# Patient Record
Sex: Female | Born: 1990 | Race: White | Hispanic: No | Smoking: Current every day smoker
Health system: Southern US, Community
[De-identification: ages and names within clinical notes are randomized; demographics above are authoritative.]

## PROBLEM LIST (undated history)

## (undated) DIAGNOSIS — B977 Papillomavirus as the cause of diseases classified elsewhere: Secondary | ICD-10-CM

## (undated) HISTORY — PX: FINGER SURGERY: SHX640

## (undated) HISTORY — DX: Papillomavirus as the cause of diseases classified elsewhere: B97.7

---
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
## 2013-08-12 ENCOUNTER — Encounter (HOSPITAL_COMMUNITY): Payer: Self-pay | Admitting: Emergency Medicine

## 2013-08-12 ENCOUNTER — Emergency Department (HOSPITAL_COMMUNITY)
Admission: EM | Admit: 2013-08-12 | Discharge: 2013-08-12 | Disposition: A | Discharge status: Home / Self Care | Payer: Medicaid Other | Attending: Emergency Medicine | Admitting: Emergency Medicine

## 2013-08-12 DIAGNOSIS — B9789 Other viral agents as the cause of diseases classified elsewhere: Secondary | ICD-10-CM | POA: Diagnosis not present

## 2013-08-12 DIAGNOSIS — F17200 Nicotine dependence, unspecified, uncomplicated: Secondary | ICD-10-CM | POA: Insufficient documentation

## 2013-08-12 DIAGNOSIS — B349 Viral infection, unspecified: Secondary | ICD-10-CM

## 2013-08-12 DIAGNOSIS — R112 Nausea with vomiting, unspecified: Secondary | ICD-10-CM | POA: Diagnosis present

## 2013-08-12 NOTE — Progress Notes (Signed)
[facility] did not get to see patient but will be sending information about [facility] orange Card using the address provided, to help patient establish primary care.

## 2013-08-12 NOTE — Discharge Instructions (Signed)
Viral Infections °A virus is a type of germ. Viruses can cause: °· Minor sore throats. °· Aches and pains. °· Headaches. °· Runny nose. °· Rashes. °· Watery eyes. °· Tiredness. °· Coughs. °· Loss of appetite. °· Feeling sick to your stomach (nausea). °· Throwing up (vomiting). °· Watery poop (diarrhea). °HOME CARE  °· Only take medicines as told by your doctor. °· Drink enough water and fluids to keep your pee (urine) clear or pale yellow. Sports drinks are a good choice. °· Get plenty of rest and eat healthy. Soups and broths with crackers or rice are fine. °GET HELP RIGHT AWAY IF:  °· You have a very bad headache. °· You have shortness of breath. °· You have chest pain or neck pain. °· You have an unusual rash. °· You cannot stop throwing up. °· You have watery poop that does not stop. °· You cannot keep fluids down. °· You or your child has a temperature by mouth above 102° F (38.9° C), not controlled by medicine. °· Your baby is older than 3 months with a rectal temperature of 102° F (38.9° C) or higher. °· Your baby is 3 months old or younger with a rectal temperature of 100.4° F (38° C) or higher. °MAKE SURE YOU:  °· Understand these instructions. °· Will watch this condition. °· Will get help right away if you are not doing well or get worse. °Document Released: 03/02/2008 Document Revised: 06/12/2011 Document Reviewed: 07/26/2010 °ExitCare® Patient Information ©2014 [employer]. ° °

## 2013-08-12 NOTE — ED Provider Notes (Signed)
CSN: [id number]     Arrival date & time 08/12/13  [id number] History   First MD Initiated Contact with Patient 08/12/13 1015     Chief Complaint  Patient presents with  . Nausea  . Emesis  . Diarrhea     (Consider location/radiation/quality/duration/timing/severity/associated sxs/prior Treatment) HPI Comments: Patient presents emergency department with chief complaint of nausea, vomiting, diarrhea, that started last night. She has had one episode of vomiting and one episode of diarrhea. She denies any hematemesis or melena. She has not tried taking anything to alleviate her symptoms. She states that she also has body aches, but denies any fevers or chills. She denies any aggravating or alleviating factors.   The history is provided by the patient. No language interpreter was used.    History reviewed. No pertinent past medical history. History reviewed. No pertinent past surgical history. History reviewed. No pertinent family history. History  Substance Use Topics  . Smoking status: Current Every Day Smoker -- 0.50 packs/day    Types: Cigarettes  . Smokeless tobacco: Not on file  . Alcohol Use: No   OB History   Grav Para Term Preterm Abortions TAB SAB Ect Mult Living                 Review of Systems  Constitutional: Negative for fever and chills.  Respiratory: Negative for shortness of breath.   Cardiovascular: Negative for chest pain.  Gastrointestinal: Positive for nausea, vomiting and diarrhea. Negative for constipation.  Genitourinary: Negative for dysuria.      Allergies  No known allergies  Home Medications   Prior to Admission medications   Not on File   BP 106/71  Pulse 95  Temp(Src) 98.7 F (37.1 C) (Oral)  Resp 18  SpO2 100%  LMP 07/13/2013 Physical Exam  Nursing note and vitals reviewed. Constitutional: She is oriented to person, place, and time. She appears well-developed and well-nourished.  HENT:  Head: Normocephalic and atraumatic.  Eyes:  Conjunctivae and EOM are normal. Pupils are equal, round, and reactive to light.  Neck: Normal range of motion. Neck supple.  Cardiovascular: Normal rate and regular rhythm.  Exam reveals no gallop and no friction rub.   No murmur heard. Pulmonary/Chest: Effort normal and breath sounds normal. No respiratory distress. She has no wheezes. She has no rales. She exhibits no tenderness.  Abdominal: Soft. Bowel sounds are normal. She exhibits no distension and no mass. There is no tenderness. There is no rebound and no guarding.  Musculoskeletal: Normal range of motion. She exhibits no edema and no tenderness.  Neurological: She is alert and oriented to person, place, and time.  Skin: Skin is warm and dry.  Psychiatric: She has a normal mood and affect. Her behavior is normal. Judgment and thought content normal.    ED Course  Procedures (including critical care time) Labs Review Labs Reviewed - No data to display  Imaging Review No results found.   EKG Interpretation None      MDM   Final diagnoses:  Viral syndrome    Patient with an episode of nausea, vomiting, and diarrhea last night. She is well-appearing. She denies any apparent distress. Will prescribe Zofran, and recommend PCP followup. Patient understands and agrees with the plan. She is stable and ready for discharge.  Filed Vitals:   08/12/13 1004  BP: 106/71  Pulse: 95  Temp: 98.7 F (37.1 C)  Resp: [id number]18     Rihana Kiddy, [place]PA-C 08/12/13 1114

## 2013-08-12 NOTE — ED Notes (Signed)
Pt states that she has been having NVD since last night.  One episode of vomiting and one episode of diarrhea last night.  No pain except for body aches.

## 2013-08-18 NOTE — ED Provider Notes (Signed)
Medical screening examination/treatment/procedure(s) were performed by non-physician practitioner and as supervising physician I was immediately available for consultation/collaboration.   EKG Interpretation None        Geneviene Tesch H Larkin Alfred, MD 08/18/13 1502 

## 2013-10-14 ENCOUNTER — Emergency Department (HOSPITAL_COMMUNITY)
Admission: EM | Admit: 2013-10-14 | Discharge: 2013-10-14 | Disposition: A | Discharge status: Home / Self Care | Payer: Medicaid Other | Attending: Emergency Medicine | Admitting: Emergency Medicine

## 2013-10-14 ENCOUNTER — Encounter (HOSPITAL_COMMUNITY): Payer: Self-pay | Admitting: Emergency Medicine

## 2013-10-14 DIAGNOSIS — N898 Other specified noninflammatory disorders of vagina: Secondary | ICD-10-CM | POA: Diagnosis not present

## 2013-10-14 DIAGNOSIS — F17200 Nicotine dependence, unspecified, uncomplicated: Secondary | ICD-10-CM | POA: Diagnosis not present

## 2013-10-14 DIAGNOSIS — J029 Acute pharyngitis, unspecified: Secondary | ICD-10-CM | POA: Insufficient documentation

## 2013-10-14 DIAGNOSIS — Z3202 Encounter for pregnancy test, result negative: Secondary | ICD-10-CM | POA: Insufficient documentation

## 2013-10-14 DIAGNOSIS — N12 Tubulo-interstitial nephritis, not specified as acute or chronic: Secondary | ICD-10-CM | POA: Insufficient documentation

## 2013-10-14 DIAGNOSIS — R51 Headache: Secondary | ICD-10-CM | POA: Diagnosis not present

## 2013-10-14 DIAGNOSIS — Z791 Long term (current) use of non-steroidal anti-inflammatories (NSAID): Secondary | ICD-10-CM | POA: Diagnosis not present

## 2013-10-14 DIAGNOSIS — R509 Fever, unspecified: Secondary | ICD-10-CM | POA: Diagnosis present

## 2013-10-14 LAB — COMPREHENSIVE METABOLIC PANEL
ALK PHOS: 59 U/L (ref 39–117)
ALT: 12 U/L (ref 0–35)
AST: 16 U/L (ref 0–37)
Albumin: 4 g/dL (ref 3.5–5.2)
Anion gap: 18 — ABNORMAL HIGH (ref 5–15)
BILIRUBIN TOTAL: 0.3 mg/dL (ref 0.3–1.2)
BUN: 10 mg/dL (ref 6–23)
CHLORIDE: 96 meq/L (ref 96–112)
CO2: 23 meq/L (ref 19–32)
Calcium: 9.5 mg/dL (ref 8.4–10.5)
Creatinine, Ser: 0.61 mg/dL (ref 0.50–1.10)
GFR calc Af Amer: >90 mL/min (ref >90)
GLUCOSE: 106 mg/dL — AB (ref 70–99)
POTASSIUM: 3.9 meq/L (ref 3.7–5.3)
SODIUM: 137 meq/L (ref 137–147)
TOTAL PROTEIN: 8 g/dL (ref 6.0–8.3)

## 2013-10-14 LAB — URINALYSIS, ROUTINE W REFLEX MICROSCOPIC
BILIRUBIN URINE: NEGATIVE
Glucose, UA: NEGATIVE mg/dL
Ketones, ur: NEGATIVE mg/dL
Nitrite: NEGATIVE
Protein, ur: 30 mg/dL — AB
SPECIFIC GRAVITY, URINE: 1.017 (ref 1.005–1.030)
Urobilinogen, UA: 2 mg/dL — ABNORMAL HIGH (ref 0.0–1.0)
pH: 6 (ref 5.0–8.0)

## 2013-10-14 LAB — CBC WITH DIFFERENTIAL/PLATELET
Basophils Absolute: 0 10*3/uL (ref 0.0–0.1)
Basophils Relative: 0 % (ref 0–1)
EOS ABS: 0.1 10*3/uL (ref 0.0–0.7)
Eosinophils Relative: 1 % (ref 0–5)
HCT: 40.2 % (ref 36.0–46.0)
HEMOGLOBIN: 13.8 g/dL (ref 12.0–15.0)
LYMPHS ABS: 1.2 10*3/uL (ref 0.7–4.0)
LYMPHS PCT: 15 % (ref 12–46)
MCH: 31.4 pg (ref 26.0–34.0)
MCHC: 34.3 g/dL (ref 30.0–36.0)
MCV: 91.4 fL (ref 78.0–100.0)
MONOS PCT: 15 % — AB (ref 3–12)
Monocytes Absolute: 1.1 10*3/uL — ABNORMAL HIGH (ref 0.1–1.0)
NEUTROS PCT: 69 % (ref 43–77)
Neutro Abs: 5.2 10*3/uL (ref 1.7–7.7)
Platelets: 172 10*3/uL (ref 150–400)
RBC: 4.4 MIL/uL (ref 3.87–5.11)
RDW: 13.8 % (ref 11.5–15.5)
WBC: 7.5 10*3/uL (ref 4.0–10.5)

## 2013-10-14 LAB — URINE MICROSCOPIC-ADD ON

## 2013-10-14 LAB — WET PREP, GENITAL
Trich, Wet Prep: NONE SEEN
Yeast Wet Prep HPF POC: NONE SEEN

## 2013-10-14 LAB — LIPASE, BLOOD: Lipase: 19 U/L (ref 11–59)

## 2013-10-14 LAB — POC URINE PREG, ED: Preg Test, Ur: NEGATIVE

## 2013-10-14 MED ORDER — CEPHALEXIN 500 MG PO CAPS: 500.0000 mg | ORAL_CAPSULE | Freq: Four times a day (QID) | ORAL | Status: DC

## 2013-10-14 MED ORDER — KETOROLAC TROMETHAMINE 30 MG/ML IJ SOLN
30.0000 mg | Freq: Once | INTRAMUSCULAR | Status: AC
  Administered 2013-10-14: 30 mg via INTRAVENOUS
  Filled 2013-10-14: qty 1

## 2013-10-14 MED ORDER — ONDANSETRON HCL 4 MG/2ML IJ SOLN
4.0000 mg | Freq: Once | INTRAMUSCULAR | Status: AC
  Administered 2013-10-14: 4 mg via INTRAVENOUS
  Filled 2013-10-14: qty 2

## 2013-10-14 MED ORDER — PROMETHAZINE HCL 25 MG PO TABS: 25.0000 mg | ORAL_TABLET | Freq: Four times a day (QID) | ORAL | Status: DC | PRN

## 2013-10-14 MED ORDER — SODIUM CHLORIDE 0.9 % IV BOLUS (SEPSIS)
1000.0000 mL | Freq: Once | INTRAVENOUS | Status: AC
  Administered 2013-10-14: 1000 mL via INTRAVENOUS

## 2013-10-14 MED ORDER — DEXTROSE 5 % IV SOLN
1.0000 g | Freq: Once | INTRAVENOUS | Status: AC
  Administered 2013-10-14: 1 g via INTRAVENOUS
  Filled 2013-10-14: qty 10

## 2013-10-14 NOTE — Discharge Planning (Signed)
[facility] [employer]  Spoke with the patient regarding primary care resources and [facility]. Patient states she recently applied for medicaid. Orange card application and instructions provided to the patient. Resource guide and my contact information also given for any future questions or concerns. No other Community Liaison needs identified at this time.

## 2013-10-14 NOTE — ED Notes (Signed)
PA student at the bedside.  

## 2013-10-14 NOTE — ED Notes (Addendum)
Pt states she was in the sun all day Friday, Saturday woke up having chills and vomited. No diarrhea. Didn't check her temp but states she felt hot. Today says her throat hurts and she has fever blisters all in her mouth. States she has been getting headaches above her left ear and has one now. Pt also states when she urinates it has a strong odor.

## 2013-10-14 NOTE — ED Notes (Signed)
Tatyana and PA student at bedside to do pelvic

## 2013-10-14 NOTE — ED Notes (Signed)
amb to the bathroom for urine specimen 

## 2013-10-14 NOTE — ED Notes (Signed)
PA student back at the bedside.

## 2013-10-14 NOTE — ED Provider Notes (Signed)
CSN: [id number]     Arrival date & time 10/14/13  [id number] History   First MD Initiated Contact with Patient 10/14/13 [phone]     Chief Complaint  Patient presents with  . Fever  . Emesis     (Consider location/radiation/quality/duration/timing/severity/associated sxs/prior Treatment) HPI Rebecca Atkinson is a 23 y.o. female who presents to ED with complaint of nausea, vomiting, dysuria, mouth blisters. Pt states she started feeling bad 4 days ago. States at that time started her period and attributed it to that. States was feeling a little better the next day, and was out int he sun at the pool all day, admits to alcohol. States that evening she began having nausea, vomiting, chills. States vomited on and off since then. Decreased appetite. Malodorous urine, dysuria, frequency and urgency. Did not check her temp, but admits to chills. This morning woke up with blisters to her tongue and upper lip, blisters are painful. Pt denies any abnormal vaginal discharge. Has been taking ibuprofen and tylenol with no relief. Pt has a 4yo daughter with URI symptoms.   History reviewed. No pertinent past medical history. History reviewed. No pertinent past surgical history. No family history on file. History  Substance Use Topics  . Smoking status: Current Every Day Smoker -- 0.50 packs/day    Types: Cigarettes  . Smokeless tobacco: Not on file  . Alcohol Use: Yes     Comment: occas   OB History   Grav Para Term Preterm Abortions TAB SAB Ect Mult Living                 Review of Systems  Constitutional: Positive for chills. Negative for fever.  HENT: Positive for mouth sores and sore throat. Negative for ear pain.   Respiratory: Negative for cough, chest tightness and shortness of breath.   Cardiovascular: Negative for chest pain, palpitations and leg swelling.  Gastrointestinal: Positive for nausea and vomiting. Negative for abdominal pain and diarrhea.  Genitourinary: Positive for dysuria, urgency,  frequency, flank pain and vaginal bleeding. Negative for vaginal discharge, vaginal pain and pelvic pain.  Musculoskeletal: Negative for arthralgias, myalgias, neck pain and neck stiffness.  Neurological: Positive for headaches. Negative for dizziness and weakness.  All other systems reviewed and are negative.     Allergies  No known allergies  Home Medications   Prior to Admission medications   Medication Sig Start Date End Date Taking? Authorizing Provider  Acetaminophen (TYLENOL PO) Take 2 tablets by mouth every 4 (four) hours as needed (pain).   Yes Historical Provider, MD  naproxen sodium (ANAPROX) 220 MG tablet Take 220 mg by mouth daily as needed (pain).   Yes Historical Provider, MD   BP 130/75  Pulse 95  Temp(Src) 98 F (36.7 C) (Oral)  Resp 20  Ht 5\' 9"  (1.753 m)  Wt 140 lb (63.504 kg)  BMI 20.67 kg/m2  SpO2 100%  LMP 10/06/2013 Physical Exam  Nursing note and vitals reviewed. Constitutional: She appears well-developed and well-nourished. No distress.  HENT:  Head: Normocephalic.  Grouped blisters to the tip of the tongue and upper lip. Tender/painful  Eyes: Conjunctivae are normal.  Neck: Neck supple.  Cardiovascular: Normal rate, regular rhythm and normal heart sounds.   Pulmonary/Chest: Effort normal and breath sounds normal. No respiratory distress. She has no wheezes. She has no rales.  Abdominal: Soft. Bowel sounds are normal. She exhibits no distension. There is no tenderness. There is no rebound.  Left CVA tenderness  Genitourinary:  Normal external genitalia.  Normal vaginal canal. Cervix normal, no CMT, no uterine or adnexal tenderness  Musculoskeletal: She exhibits no edema.  Neurological: She is alert.  Skin: Skin is warm and dry.  Psychiatric: She has a normal mood and affect. Her behavior is normal.    ED Course  Procedures (including critical care time) Labs Review Labs Reviewed  WET PREP, GENITAL - Abnormal; Notable for the following:     Clue Cells Wet Prep HPF POC FEW (*)    WBC, Wet Prep HPF POC MANY (*)    All other components within normal limits  URINALYSIS, ROUTINE W REFLEX MICROSCOPIC - Abnormal; Notable for the following:    Color, Urine AMBER (*)    APPearance CLOUDY (*)    Hgb urine dipstick MODERATE (*)    Protein, ur 30 (*)    Urobilinogen, UA 2.0 (*)    Leukocytes, UA LARGE (*)    All other components within normal limits  CBC WITH DIFFERENTIAL - Abnormal; Notable for the following:    Monocytes Relative 15 (*)    Monocytes Absolute 1.1 (*)    All other components within normal limits  COMPREHENSIVE METABOLIC PANEL - Abnormal; Notable for the following:    Glucose, Bld 106 (*)    Anion gap 18 (*)    All other components within normal limits  URINE MICROSCOPIC-ADD ON - Abnormal; Notable for the following:    Bacteria, UA FEW (*)    All other components within normal limits  GC/CHLAMYDIA PROBE AMP  URINE CULTURE  LIPASE, BLOOD  POC URINE PREG, ED    Imaging Review No results found.   EKG Interpretation None      MDM   Final diagnoses:  Pyelonephritis    Patient with urinary symptoms, left flank pain, nausea, vomiting, chills at home, headache. Will get labs, urinalysis, pelvic exam. IV fluids and Zofran ordered.  10:23 AM Patient's urine is infected, she does have many white blood cells on wet prep however her pelvic exam is unremarkable, we'll hold off on treating. Most likely rocephin. Patient received 1 g of Rocephin IV in emergency department. She's feeling better. Appropriate for outpatient treatment, given no current vomiting, tolerating by mouth fluids. Followup with primary care Dr. Joseph PieriniHome on Keflex, Phenergan for nausea. Discussed sings and symptoms that should prompt her return.   Filed Vitals:   10/14/13 0900 10/14/13 0930 10/14/13 1030 10/14/13 1058  BP: 104/59 112/70 96/59 111/71  Pulse: 75 76 77   Temp:      TempSrc:      Resp:    20  Height:      Weight:      SpO2: 98%  100% 100% 100%       Enoch Moffa A Veanna Dower, PA-C 10/14/13 1526

## 2013-10-14 NOTE — Discharge Instructions (Signed)
Take ibuprofen for pain. Phenergan for nausea, this medication will sedate you, do not drive or work while taking. Take keflex as prescribed until all gone. Follow up with primary care doctor in 1 week for recheck.    Pyelonephritis, Adult Pyelonephritis is a kidney infection. In general, there are 2 main types of pyelonephritis:  Infections that come on quickly without any warning (acute pyelonephritis).  Infections that persist for a long period of time (chronic pyelonephritis). CAUSES  Two main causes of pyelonephritis are:  Bacteria traveling from the bladder to the kidney. This is a problem especially in pregnant women. The urine in the bladder can become filled with bacteria from multiple causes, including:  Inflammation of the prostate gland (prostatitis).  Sexual intercourse in females.  Bladder infection (cystitis).  Bacteria traveling from the bloodstream to the tissue part of the kidney. Problems that may increase your risk of getting a kidney infection include:  Diabetes.  Kidney stones or bladder stones.  Cancer.  Catheters placed in the bladder.  Other abnormalities of the kidney or ureter. SYMPTOMS   Abdominal pain.  Pain in the side or flank area.  Fever.  Chills.  Upset stomach.  Blood in the urine (dark urine).  Frequent urination.  Strong or persistent urge to urinate.  Burning or stinging when urinating. DIAGNOSIS  Your caregiver may diagnose your kidney infection based on your symptoms. A urine sample may also be taken. TREATMENT  In general, treatment depends on how severe the infection is.   If the infection is mild and caught early, your caregiver may treat you with oral antibiotics and send you home.  If the infection is more severe, the bacteria may have gotten into the bloodstream. This will require intravenous (IV) antibiotics and a hospital stay. Symptoms may include:  High fever.  Severe flank pain.  Shaking chills.  Even  after a hospital stay, your caregiver may require you to be on oral antibiotics for a period of time.  Other treatments may be required depending upon the cause of the infection. HOME CARE INSTRUCTIONS   Take your antibiotics as directed. Finish them even if you start to feel better.  Make an appointment to have your urine checked to make sure the infection is gone.  Drink enough fluids to keep your urine clear or pale yellow.  Take medicines for the bladder if you have urgency and frequency of urination as directed by your caregiver. SEEK IMMEDIATE MEDICAL CARE IF:   You have a fever or persistent symptoms for more than 2-3 days.  You have a fever and your symptoms suddenly get worse.  You are unable to take your antibiotics or fluids.  You develop shaking chills.  You experience extreme weakness or fainting.  There is no improvement after 2 days of treatment. MAKE SURE YOU:  Understand these instructions.  Will watch your condition.  Will get help right away if you are not doing well or get worse. Document Released: 03/20/2005 Document Revised: 09/19/2011 Document Reviewed: 08/24/2010 [facility] Patient Information 2015 [place], [place]. This information is not intended to replace advice given to you by your health care provider. Make sure you discuss any questions you have with your health care provider.

## 2013-10-15 LAB — URINE CULTURE
CULTURE: NO GROWTH
Colony Count: NO GROWTH

## 2013-10-15 LAB — GC/CHLAMYDIA PROBE AMP
CT Probe RNA: NEGATIVE
GC Probe RNA: NEGATIVE

## 2013-10-16 NOTE — ED Provider Notes (Signed)
Medical screening examination/treatment/procedure(s) were performed by non-physician practitioner and as supervising physician I was immediately available for consultation/collaboration.   EKG Interpretation None       Yanna Leaks, MD 10/16/13 0726 

## 2014-06-09 ENCOUNTER — Emergency Department (HOSPITAL_COMMUNITY)
Admission: EM | Admit: 2014-06-09 | Discharge: 2014-06-09 | Disposition: A | Discharge status: Home / Self Care | Payer: Medicaid Other | Attending: Emergency Medicine | Admitting: Emergency Medicine

## 2014-06-09 ENCOUNTER — Encounter (HOSPITAL_COMMUNITY): Payer: Self-pay | Admitting: Emergency Medicine

## 2014-06-09 DIAGNOSIS — Z720 Tobacco use: Secondary | ICD-10-CM | POA: Insufficient documentation

## 2014-06-09 DIAGNOSIS — M549 Dorsalgia, unspecified: Secondary | ICD-10-CM | POA: Insufficient documentation

## 2014-06-09 DIAGNOSIS — M542 Cervicalgia: Secondary | ICD-10-CM | POA: Diagnosis not present

## 2014-06-09 DIAGNOSIS — Z79899 Other long term (current) drug therapy: Secondary | ICD-10-CM | POA: Diagnosis not present

## 2014-06-09 DIAGNOSIS — Z792 Long term (current) use of antibiotics: Secondary | ICD-10-CM | POA: Diagnosis not present

## 2014-06-09 DIAGNOSIS — M62838 Other muscle spasm: Secondary | ICD-10-CM | POA: Insufficient documentation

## 2014-06-09 DIAGNOSIS — M25511 Pain in right shoulder: Secondary | ICD-10-CM | POA: Diagnosis present

## 2014-06-09 MED ORDER — KETOROLAC TROMETHAMINE 60 MG/2ML IM SOLN
60.0000 mg | Freq: Once | INTRAMUSCULAR | Status: AC
  Administered 2014-06-09: 60 mg via INTRAMUSCULAR
  Filled 2014-06-09: qty 2

## 2014-06-09 MED ORDER — NAPROXEN 500 MG PO TABS: 500.0000 mg | ORAL_TABLET | Freq: Two times a day (BID) | ORAL | Status: DC

## 2014-06-09 MED ORDER — CYCLOBENZAPRINE HCL 10 MG PO TABS: 10.0000 mg | ORAL_TABLET | Freq: Three times a day (TID) | ORAL | Status: DC | PRN

## 2014-06-09 NOTE — ED Provider Notes (Signed)
CSN: [id number]     Arrival date & time 06/09/14  0622 History   First MD Initiated Contact with Patient 06/09/14 0730     Chief Complaint  Patient presents with  . Shoulder Pain     (Consider location/radiation/quality/duration/timing/severity/associated sxs/prior Treatment) HPI Comments: Rebecca Atkinson is a 24 y.o. female with no significant PMHx, who presents to the ED with complaints of right-sided neck/shoulder pain 3 weeks. She points to her right trapezius and right-sided paraspinous muscles in the thoracic region, stating the pain is 10/10 constant sharp pain radiating into the shoulder blade, worse with movement, and improved with heat. 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 She is right handed. Admits she has been under a lot of stress and tends to tense her shoulders up when she's stressed. She denies any fevers, chills, chest pain, shortness breath, abdominal pain, nausea, vomiting, diarrhea constipation, dysuria, hematuria, numbness, tingling, weakness, stiffness, vision changes, headaches, history of cancer, or IV drug use. LMP 05/14/14.  Patient is a 24 y.o. female presenting with shoulder pain. The history is provided by the patient. No language interpreter was used.  Shoulder Pain Location:  Shoulder Time since incident:  3 weeks Injury: no   Shoulder location:  R shoulder Pain details:    Quality:  Sharp   Radiates to:  Back (and neck/trapezius)   Severity:  Severe   Onset quality:  Gradual   Duration:  3 weeks   Timing:  Constant   Progression:  Unchanged Chronicity:  New Handedness:  Right-handed Dislocation: no   Prior injury to area:  No Relieved by:  Heat Worsened by:  Movement Ineffective treatments:  None tried Associated symptoms: back pain (R sided thoracic), decreased range of  motion (due to pain in trapezius) and neck pain (R sided)   Associated symptoms: no fever, no muscle weakness, no numbness, no stiffness, no swelling and no tingling     History reviewed. No pertinent past medical history. Past Surgical History  Procedure Laterality Date  . Finger surgery     History reviewed. No pertinent family history. History  Substance Use Topics  . Smoking status: Current Every Day Smoker -- 0.25 packs/day    Types: Cigarettes  . Smokeless tobacco: Not on file  . Alcohol Use: Yes     Comment: rare   OB History    No data available     Review of Systems  Constitutional: Negative for fever and chills.  Respiratory: Negative for shortness of breath.   Cardiovascular: Negative for chest pain.  Gastrointestinal: Negative for nausea, vomiting, abdominal pain, diarrhea and constipation.  Genitourinary: Negative for dysuria and hematuria.  Musculoskeletal: Positive for myalgias (R trapezius), back pain (R sided thoracic) and neck pain (R sided). Negative for arthralgias, stiffness and neck stiffness.  Skin: Negative for color change.  Allergic/Immunologic: Negative for immunocompromised state.  Neurological: Negative for dizziness, weakness, light-headedness, numbness and headaches.  Psychiatric/Behavioral: Negative for confusion.   10 Systems reviewed and are negative for acute change except as noted in the HPI.    Allergies  No known allergies  Home Medications   Prior to Admission medications   Medication Sig Start Date End Date Taking? Authorizing Provider  acetaminophen (TYLENOL) 500 MG tablet Take 1,000 mg by mouth every 6 (six) hours as needed for mild pain or  moderate pain.   Yes Historical Provider, MD  cephALEXin (KEFLEX) 500 MG capsule Take 1 capsule (500 mg total) by mouth 4 (four) times daily. Patient not taking: Reported on 06/09/2014 10/14/13   Jaynie Crumbleatyana Kirichenko, PA-C  promethazine (PHENERGAN) 25 MG tablet Take 1 tablet (25 mg total) by mouth  every 6 (six) hours as needed for nausea or vomiting. Patient not taking: Reported on 06/09/2014 10/14/13   Tatyana Kirichenko, PA-C   BP 115/76 mmHg  Pulse 81  Temp(Src) 97.7 F (36.5 C) (Oral)  Resp 18  SpO2 100%  LMP 05/14/2014 (Exact Date) Physical Exam  Constitutional: She is oriented to person, place, and time. Vital signs are normal. She appears well-developed and well-nourished.  Non-toxic appearance. No distress.  Afebrile, nontoxic, NAD  HENT:  Head: Normocephalic and atraumatic.  Mouth/Throat: Oropharynx is clear and moist and mucous membranes are normal.  Eyes: Conjunctivae and EOM are normal. Right eye exhibits no discharge. Left eye exhibits no discharge.  Neck: Neck supple. Muscular tenderness present. No spinous process tenderness present. No rigidity. Decreased range of motion (slightly, due to pain) present.    Slightly diminished cervical spine ROM with R lateral neck movements due to pain, without spinous process TTP, no bony stepoffs or deformities, exquisite R paraspinous muscle and R trapezius muscle TTP and palpable muscle spasms. No rigidity or meningeal signs. No bruising or swelling.   Cardiovascular: Normal rate, regular rhythm, normal heart sounds and intact distal pulses.  Exam reveals no gallop and no friction rub.   No murmur heard. Distal pulses intact  Pulmonary/Chest: Effort normal and breath sounds normal. No respiratory distress. She has no decreased breath sounds. She has no wheezes. She has no rhonchi. She has no rales.  Abdominal: Soft. Normal appearance and bowel sounds are normal. She exhibits no distension. There is no tenderness. There is no rigidity, no rebound and no guarding.  Musculoskeletal:       Right shoulder: She exhibits tenderness and spasm. She exhibits normal range of motion, no bony tenderness, no swelling, no crepitus and no deformity.       Arms: R shoulder with FROM intact, mild thoracic paraspinous muscle tenderness extending up  to trapezius and around scapula, without bony TTP or deformity, with palpable spasms noted. Strength 5/5 in all extremities, sensation grossly intact in all extremities, distal pulses intact, no bruising or swelling.  Neurological: She is alert and oriented to person, place, and time. She has normal strength. No sensory deficit.  Skin: Skin is warm, dry and intact. No rash noted.  Psychiatric: She has a normal mood and affect. Her behavior is normal.  Nursing note and vitals reviewed.   ED Course  Procedures (including critical care time) Labs Review Labs Reviewed - No data to display  Imaging Review No results found.   EKG Interpretation None      MDM   Final diagnoses:  Trapezius muscle spasm  Neck pain on right side    24 y.o. female here with R trapezius/thoracic paraspinous muscle spasm and pain x3wks. 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 R handed. Exam reveals very spasmodic muscles which are TTP. No midline spinal bony TTP, no bony TTP in shoulder, no injuries. No focal weakness, extremities neurovascularly intact. Will give toradol here. Doubt need for imaging. Will send home with naprosyn BID x7 days then PRN afterwards. Discussed use of heat and massage. Will give flexeril but discussed proper use of this and no driving while taking  med. Pt has f/up appt with PCP coming up, just got medicaid and was assigned a provider. I explained the diagnosis and have given explicit precautions to return to the ER including for any other new or worsening symptoms. The patient understands and accepts the medical plan as it's been dictated and I have answered their questions. Discharge instructions concerning home care and prescriptions have been given. The patient is STABLE and is discharged to home in good condition.  BP 115/76 mmHg  Pulse 81  Temp(Src) 97.7 F (36.5 C) (Oral)  Resp 18  SpO2 100%  LMP 05/14/2014 (Exact Date)  Meds ordered this encounter    Medications  . ketorolac (TORADOL) injection 60 mg    Sig:   . naproxen (NAPROSYN) 500 MG tablet    Sig: Take 1 tablet (500 mg total) by mouth 2 (two) times daily with a meal. X 7 days. Then afterwards, you may take 1 tab PO BID PRN pain    Dispense:  30 tablet    Refill:  0    Order Specific Question:  Supervising Provider    Answer:  Hyacinth Meeker, BRIAN [[id number]]  . cyclobenzaprine (FLEXERIL) 10 MG tablet    Sig: Take 1 tablet (10 mg total) by mouth 3 (three) times daily as needed for muscle spasms.    Dispense:  15 tablet    Refill:  0    Order Specific Question:  Supervising Provider    Answer:  Eber Hong [[id number]]       Givanni Staron Camprubi-Soms, PA-C 06/09/14 1610  Mancel Bale, MD 06/10/14 1149

## 2014-06-09 NOTE — ED Notes (Signed)
Pt reports right neck/upper back pain with movement for 3 weeks. Pt denies injury or other symptoms.

## 2014-06-09 NOTE — Discharge Instructions (Signed)
Take naprosyn as directed for inflammation and pain with tylenol for breakthrough pain and flexeril for muscle relaxation. Do not drive or operate machinery with muscle relaxation use. Use heat over the area, no more than 20 minutes at a time every hour. Use a tennis ball to massage the area using gentle pressure to help resolve the spasms. Follow up with primary care physician for recheck of ongoing symptoms. Return to ER for emergent changing or worsening of symptoms.     Muscle Cramps and Spasms Muscle cramps and spasms occur when a muscle or muscles tighten and you have no control over this tightening (involuntary muscle contraction). They are a common problem and can develop in any muscle. The most common place is in the calf muscles of the leg. Both muscle cramps and muscle spasms are involuntary muscle contractions, but they also have differences:   Muscle cramps are sporadic and painful. They may last a few seconds to a quarter of an hour. Muscle cramps are often more forceful and last longer than muscle spasms.  Muscle spasms may or may not be painful. They may also last just a few seconds or much longer. CAUSES  It is uncommon for cramps or spasms to be due to a serious underlying problem. In many cases, the cause of cramps or spasms is unknown. Some common causes are:   Overexertion.   Overuse from repetitive motions (doing the same thing over and over).   Remaining in a certain position for a long period of time.   Improper preparation, form, or technique while performing a sport or activity.   Dehydration.   Injury.   Side effects of some medicines.   Abnormally low levels of the salts and ions in your blood (electrolytes), especially potassium and calcium. This could happen if you are taking water pills (diuretics) or you are pregnant.  Some underlying medical problems can make it more likely to develop cramps or spasms. These include, but are not limited to:    Diabetes.   Parkinson disease.   Hormone disorders, such as thyroid problems.   Alcohol abuse.   Diseases specific to muscles, joints, and bones.   Blood vessel disease where not enough blood is getting to the muscles.  HOME CARE INSTRUCTIONS   Stay well hydrated. Drink enough water and fluids to keep your urine clear or pale yellow.  It may be helpful to massage, stretch, and relax the affected muscle.  For tight or tense muscles, use a warm towel, heating pad, or hot shower water directed to the affected area.  If you are sore or have pain after a cramp or spasm, applying ice to the affected area may relieve discomfort.  Put ice in a plastic bag.  Place a towel between your skin and the bag.  Leave the ice on for 15-20 minutes, 03-04 times a day.  Medicines used to treat a known cause of cramps or spasms may help reduce their frequency or severity. Only take over-the-counter or prescription medicines as directed by your caregiver. SEEK MEDICAL CARE IF:  Your cramps or spasms get more severe, more frequent, or do not improve over time.  MAKE SURE YOU:   Understand these instructions.  Will watch your condition.  Will get help right away if you are not doing well or get worse. Document Released: 09/09/2001 Document Revised: 07/15/2012 Document Reviewed: 03/06/2012 [facility] Patient Information 2015 [place], [place]. This information is not intended to replace advice given to you by your health care  provider. Make sure you discuss any questions you have with your health care provider.  Heat Therapy Heat therapy can help ease sore, stiff, injured, and tight muscles and joints. Heat relaxes your muscles, which may help ease your pain.  RISKS AND COMPLICATIONS If you have any of the following conditions, do not use heat therapy unless your health care provider has approved:  Poor circulation.  Healing wounds or scarred skin in the area being treated.  Diabetes,  heart disease, or high blood pressure.  Not being able to feel (numbness) the area being treated.  Unusual swelling of the area being treated.  Active infections.  Blood clots.  Cancer.  Inability to communicate pain. This may include young children and people who have problems with their brain function (dementia).  Pregnancy. Heat therapy should only be used on old, pre-existing, or long-lasting (chronic) injuries. Do not use heat therapy on new injuries unless directed by your health care provider. HOW TO USE HEAT THERAPY There are several different kinds of heat therapy, including:  Moist heat pack.  Warm water bath.  Hot water bottle.  Electric heating pad.  Heated gel pack.  Heated wrap.  Electric heating pad. Use the heat therapy method suggested by your health care provider. Follow your health care provider's instructions on when and how to use heat therapy. GENERAL HEAT THERAPY RECOMMENDATIONS  Do not sleep while using heat therapy. Only use heat therapy while you are awake.  Your skin may turn pink while using heat therapy. Do not use heat therapy if your skin turns red.  Do not use heat therapy if you have new pain.  High heat or long exposure to heat can cause burns. Be careful when using heat therapy to avoid burning your skin.  Do not use heat therapy on areas of your skin that are already irritated, such as with a rash or sunburn. SEEK MEDICAL CARE IF:  You have blisters, redness, swelling, or numbness.  You have new pain.  Your pain is worse. MAKE SURE YOU:  Understand these instructions.  Will watch your condition.  Will get help right away if you are not doing well or get worse. Document Released: 06/12/2011 Document Revised: 08/04/2013 Document Reviewed: 05/13/2013 [facility] Patient Information 2015 [place], [place]. This information is not intended to replace advice given to you by your health care provider. Make sure you discuss any  questions you have with your health care provider.

## 2014-06-09 NOTE — ED Notes (Signed)
Pt is c/o right shoulder pain  Pt states she has been having issues with it for the past couple weeks but this morning she woke up and had pain that radiates down to her shoulder blade and up into the side of her neck  Pt states the area feels swollen

## 2014-09-16 ENCOUNTER — Encounter: Payer: Self-pay | Admitting: *Deleted

## 2014-10-02 ENCOUNTER — Encounter: Payer: Medicaid Other | Admitting: Medical

## 2015-01-17 ENCOUNTER — Encounter (HOSPITAL_COMMUNITY): Payer: Self-pay | Admitting: Emergency Medicine

## 2015-01-17 ENCOUNTER — Emergency Department (HOSPITAL_COMMUNITY)
Admission: EM | Admit: 2015-01-17 | Discharge: 2015-01-17 | Disposition: A | Discharge status: Home / Self Care | Payer: Medicaid Other | Attending: Physician Assistant | Admitting: Physician Assistant

## 2015-01-17 DIAGNOSIS — Z79899 Other long term (current) drug therapy: Secondary | ICD-10-CM | POA: Diagnosis not present

## 2015-01-17 DIAGNOSIS — N390 Urinary tract infection, site not specified: Secondary | ICD-10-CM | POA: Insufficient documentation

## 2015-01-17 DIAGNOSIS — Z720 Tobacco use: Secondary | ICD-10-CM | POA: Diagnosis not present

## 2015-01-17 DIAGNOSIS — R1030 Lower abdominal pain, unspecified: Secondary | ICD-10-CM | POA: Diagnosis present

## 2015-01-17 LAB — WET PREP, GENITAL
CLUE CELLS WET PREP: NONE SEEN
TRICH WET PREP: NONE SEEN
YEAST WET PREP: NONE SEEN

## 2015-01-17 LAB — I-STAT BETA HCG BLOOD, ED (MC, WL, AP ONLY): I-stat hCG, quantitative: <5 m[IU]/mL (ref <5)

## 2015-01-17 LAB — CBC
HCT: 41.7 % (ref 36.0–46.0)
Hemoglobin: 14.2 g/dL (ref 12.0–15.0)
MCH: 30.9 pg (ref 26.0–34.0)
MCHC: 34.1 g/dL (ref 30.0–36.0)
MCV: 90.8 fL (ref 78.0–100.0)
PLATELETS: 255 10*3/uL (ref 150–400)
RBC: 4.59 MIL/uL (ref 3.87–5.11)
RDW: 13.8 % (ref 11.5–15.5)
WBC: 13 10*3/uL — ABNORMAL HIGH (ref 4.0–10.5)

## 2015-01-17 LAB — COMPREHENSIVE METABOLIC PANEL
ALK PHOS: 83 U/L (ref 38–126)
ALT: 755 U/L — AB (ref 14–54)
AST: 405 U/L — AB (ref 15–41)
Albumin: 4.9 g/dL (ref 3.5–5.0)
Anion gap: 9 (ref 5–15)
BUN: 8 mg/dL (ref 6–20)
CALCIUM: 9.2 mg/dL (ref 8.9–10.3)
CO2: 23 mmol/L (ref 22–32)
CREATININE: 0.64 mg/dL (ref 0.44–1.00)
Chloride: 108 mmol/L (ref 101–111)
GFR calc non Af Amer: >60 mL/min (ref >60)
GLUCOSE: 80 mg/dL (ref 65–99)
Potassium: 4.2 mmol/L (ref 3.5–5.1)
SODIUM: 140 mmol/L (ref 135–145)
Total Bilirubin: 0.5 mg/dL (ref 0.3–1.2)
Total Protein: 8.4 g/dL — ABNORMAL HIGH (ref 6.5–8.1)

## 2015-01-17 LAB — URINALYSIS, ROUTINE W REFLEX MICROSCOPIC
BILIRUBIN URINE: NEGATIVE
GLUCOSE, UA: NEGATIVE mg/dL
Ketones, ur: NEGATIVE mg/dL
Nitrite: NEGATIVE
PH: 5.5 (ref 5.0–8.0)
Protein, ur: 100 mg/dL — AB
Specific Gravity, Urine: 1.025 (ref 1.005–1.030)
Urobilinogen, UA: 1 mg/dL (ref 0.0–1.0)

## 2015-01-17 LAB — URINE MICROSCOPIC-ADD ON

## 2015-01-17 MED ORDER — PHENAZOPYRIDINE HCL 200 MG PO TABS: 200.0000 mg | ORAL_TABLET | Freq: Three times a day (TID) | ORAL | Status: DC

## 2015-01-17 MED ORDER — CEPHALEXIN 500 MG PO CAPS
500.0000 mg | ORAL_CAPSULE | Freq: Once | ORAL | Status: AC
  Administered 2015-01-17: 500 mg via ORAL
  Filled 2015-01-17: qty 1

## 2015-01-17 MED ORDER — PHENAZOPYRIDINE HCL 100 MG PO TABS
100.0000 mg | ORAL_TABLET | Freq: Once | ORAL | Status: AC
  Administered 2015-01-17: 100 mg via ORAL
  Filled 2015-01-17: qty 1

## 2015-01-17 MED ORDER — ONDANSETRON 4 MG PO TBDP
4.0000 mg | ORAL_TABLET | Freq: Once | ORAL | Status: AC
  Administered 2015-01-17: 4 mg via ORAL
  Filled 2015-01-17: qty 1

## 2015-01-17 MED ORDER — CEPHALEXIN 500 MG PO CAPS: 500.0000 mg | ORAL_CAPSULE | Freq: Two times a day (BID) | ORAL | Status: AC

## 2015-01-17 MED ORDER — IBUPROFEN 800 MG PO TABS
800.0000 mg | ORAL_TABLET | Freq: Once | ORAL | Status: AC
Start: 2015-01-17 — End: 2015-01-17
  Administered 2015-01-17: 800 mg via ORAL
  Filled 2015-01-17: qty 1

## 2015-01-17 NOTE — ED Notes (Signed)
Pt became nauseated with meds.  Gingerale and crackers given.  Pt holding a few min.

## 2015-01-17 NOTE — ED Notes (Signed)
Pt states yesterday began having lower abdominal pain and nausea. Today it has worsened, states it hurts to urinate and she feels like she can't urinate. "Feels like somebody has stabbed me in my who-ha." States there is a chance she could be pregnant

## 2015-01-17 NOTE — ED Provider Notes (Signed)
CSN: [id number]     Arrival date & time 01/17/15  1058 History   First MD Initiated Contact with Patient 01/17/15 1126     Chief Complaint  Patient presents with  . Abdominal Pain  . Dysuria     (Consider location/radiation/quality/duration/timing/severity/associated sxs/prior Treatment) HPI   Patient is a 24 year old female sexually active presenting with suprapubic pain. This pain has been going on for the last couple days. Reports no fevers. No change in discharge. She is sexuallyactive with her boyfriend who just returned from somewhere. She does not wear any kind of protection. Patient is due for her period has not started it.  History reviewed. No pertinent past medical history. Past Surgical History  Procedure Laterality Date  . Finger surgery     History reviewed. No pertinent family history. Social History  Substance Use Topics  . Smoking status: Current Every Day Smoker -- 0.25 packs/day    Types: Cigarettes  . Smokeless tobacco: None  . Alcohol Use: Yes     Comment: rare   OB History    No data available     Review of Systems  Constitutional: Negative for activity change and fatigue.  HENT: Negative for congestion and drooling.   Eyes: Negative for discharge.  Respiratory: Negative for cough and chest tightness.   Cardiovascular: Negative for chest pain.  Gastrointestinal: Negative for abdominal distention.  Genitourinary: Positive for dysuria, urgency, frequency and difficulty urinating. Negative for vaginal bleeding, vaginal discharge and pelvic pain.  Musculoskeletal: Negative for joint swelling.  Skin: Negative for rash.  Allergic/Immunologic: Negative for immunocompromised state.  Neurological: Negative for seizures and speech difficulty.  Psychiatric/Behavioral: Negative for behavioral problems and agitation.      Allergies  Review of patient's allergies indicates no known allergies.  Home Medications   Prior to Admission medications    Medication Sig Start Date End Date Taking? Authorizing Provider  acetaminophen (TYLENOL) 500 MG tablet Take 1,000 mg by mouth every 6 (six) hours as needed for mild pain or moderate pain.   Yes Historical Provider, MD  BIOTIN PO Take 1 tablet by mouth daily.   Yes Historical Provider, MD  Multiple Vitamin (MULTIVITAMIN WITH MINERALS) TABS tablet Take 1 tablet by mouth daily.   Yes Historical Provider, MD  cyclobenzaprine (FLEXERIL) 10 MG tablet Take 1 tablet (10 mg total) by mouth 3 (three) times daily as needed for muscle spasms. Patient not taking: Reported on 01/17/2015 06/09/14   Mercedes Camprubi-Soms, PA-C  naproxen (NAPROSYN) 500 MG tablet Take 1 tablet (500 mg total) by mouth 2 (two) times daily with a meal. X 7 days. Then afterwards, you may take 1 tab PO BID PRN pain Patient not taking: Reported on 01/17/2015 06/09/14   Mercedes Camprubi-Soms, PA-C   BP 112/72 mmHg  Pulse 100  Temp(Src) 98.3 F (36.8 C) (Oral)  Resp 18  Ht  (1.6 m)  SpO2 99%  LMP 12/28/2014 Physical Exam  Constitutional: She is oriented to person, place, and time. She appears well-developed and well-nourished.  HENT:  Head: Normocephalic and atraumatic.  Eyes: Conjunctivae are normal. Right eye exhibits no discharge.  Neck: Neck supple.  Cardiovascular: Normal rate, regular rhythm and normal heart sounds.   No murmur heard. Pulmonary/Chest: Effort normal and breath sounds normal. She has no wheezes. She has no rales.  Abdominal: Soft. She exhibits no distension. There is no tenderness.  Genitourinary: Vagina normal.  Normal vaginal exam. No CMT.  Musculoskeletal: Normal range of motion. She exhibits no edema.  Neurological: She is oriented to person, place, and time. No cranial nerve deficit.  Skin: Skin is warm and dry. No rash noted. She is not diaphoretic.  Psychiatric: She has a normal mood and affect. Her behavior is normal.  Nursing note and vitals reviewed.   ED Course  Procedures (including  critical care time) Labs Review Labs Reviewed  CBC - Abnormal; Notable for the following:    WBC 13.0 (*)    All other components within normal limits  URINALYSIS, ROUTINE W REFLEX MICROSCOPIC (NOT AT [facility]) - Abnormal; Notable for the following:    Color, Urine AMBER (*)    APPearance CLOUDY (*)    Hgb urine dipstick MODERATE (*)    Protein, ur 100 (*)    Leukocytes, UA MODERATE (*)    All other components within normal limits  URINE MICROSCOPIC-ADD ON - Abnormal; Notable for the following:    Bacteria, UA MANY (*)    All other components within normal limits  WET PREP, GENITAL  COMPREHENSIVE METABOLIC PANEL  RPR  HIV ANTIBODY (ROUTINE TESTING)  I-STAT BETA HCG BLOOD, ED ([facility], [facility], [facility] ONLY)  GC/CHLAMYDIA PROBE AMP ([place]) NOT AT [facility]    Imaging Review No results found. I have personally reviewed and evaluated these images and lab results as part of my medical decision-making.   EKG Interpretation None      MDM   Final diagnoses:  None   Patient is a pleasant 24 year old female presenting with suprapubic pain, urgency, frequency. We will check for urinary tract infection. Vaginal exam shows no CMT. We sent a wet prep and STD labs.  No fevers patient eating drinking normally. Anticipate discharge.   Rami Budhu Randall AnLyn Divit Stipp, MD 01/17/15 1201

## 2015-01-17 NOTE — ED Notes (Signed)
Unable to collect labs at this time patient is not in room. 

## 2015-01-18 LAB — RPR: RPR: NONREACTIVE

## 2015-01-18 LAB — GC/CHLAMYDIA PROBE AMP (~~LOC~~) NOT AT ARMC
Chlamydia: NEGATIVE
Neisseria Gonorrhea: NEGATIVE

## 2015-01-18 LAB — HIV ANTIBODY (ROUTINE TESTING W REFLEX): HIV Screen 4th Generation wRfx: NONREACTIVE

## 2015-02-10 ENCOUNTER — Encounter (HOSPITAL_COMMUNITY): Payer: Self-pay

## 2015-02-10 ENCOUNTER — Emergency Department (HOSPITAL_COMMUNITY)
Admission: EM | Admit: 2015-02-10 | Discharge: 2015-02-10 | Disposition: A | Discharge status: Home / Self Care | Payer: Medicaid Other | Attending: Emergency Medicine | Admitting: Emergency Medicine

## 2015-02-10 DIAGNOSIS — Z79899 Other long term (current) drug therapy: Secondary | ICD-10-CM | POA: Insufficient documentation

## 2015-02-10 DIAGNOSIS — Z3A01 Less than 8 weeks gestation of pregnancy: Secondary | ICD-10-CM | POA: Diagnosis not present

## 2015-02-10 DIAGNOSIS — O99331 Smoking (tobacco) complicating pregnancy, first trimester: Secondary | ICD-10-CM | POA: Insufficient documentation

## 2015-02-10 DIAGNOSIS — Z791 Long term (current) use of non-steroidal anti-inflammatories (NSAID): Secondary | ICD-10-CM | POA: Diagnosis not present

## 2015-02-10 DIAGNOSIS — O2341 Unspecified infection of urinary tract in pregnancy, first trimester: Secondary | ICD-10-CM | POA: Diagnosis not present

## 2015-02-10 DIAGNOSIS — O209 Hemorrhage in early pregnancy, unspecified: Secondary | ICD-10-CM | POA: Insufficient documentation

## 2015-02-10 DIAGNOSIS — F17210 Nicotine dependence, cigarettes, uncomplicated: Secondary | ICD-10-CM | POA: Diagnosis not present

## 2015-02-10 DIAGNOSIS — O9989 Other specified diseases and conditions complicating pregnancy, childbirth and the puerperium: Secondary | ICD-10-CM | POA: Diagnosis present

## 2015-02-10 LAB — URINALYSIS, ROUTINE W REFLEX MICROSCOPIC
Bilirubin Urine: NEGATIVE
Glucose, UA: NEGATIVE mg/dL
Ketones, ur: NEGATIVE mg/dL
Nitrite: POSITIVE — AB
Protein, ur: 100 mg/dL — AB
Specific Gravity, Urine: 1.028 (ref 1.005–1.030)
Urobilinogen, UA: 1 mg/dL (ref 0.0–1.0)
pH: 6 (ref 5.0–8.0)

## 2015-02-10 LAB — WET PREP, GENITAL
Trich, Wet Prep: NONE SEEN
Yeast Wet Prep HPF POC: NONE SEEN

## 2015-02-10 LAB — URINE MICROSCOPIC-ADD ON

## 2015-02-10 LAB — HCG, QUANTITATIVE, PREGNANCY: HCG, BETA CHAIN, QUANT, S: 15097 m[IU]/mL — AB (ref <5)

## 2015-02-10 MED ORDER — CEPHALEXIN 250 MG PO CAPS
1000.0000 mg | ORAL_CAPSULE | Freq: Once | ORAL | Status: AC
  Administered 2015-02-10: 1000 mg via ORAL
  Filled 2015-02-10: qty 4

## 2015-02-10 MED ORDER — CEPHALEXIN 500 MG PO CAPS: 1000.0000 mg | ORAL_CAPSULE | Freq: Two times a day (BID) | ORAL | Status: DC

## 2015-02-10 NOTE — ED Notes (Signed)
Pt. Was treated for UTI APPROXIMATELY 1 MONTH AGO AND  Was feeling better but 2 days ago the symptoms came back.  She is having dysuria and urgency and incontinence.  She is having pain and pt. . Found out 2 weeks ago she is pregnant.  Her last menses as September 26th

## 2015-02-10 NOTE — ED Provider Notes (Signed)
CSN: [id number]     Arrival date & time 02/10/15  1524 History  By signing my name below, I, Lyndel Safe, attest that this documentation has been prepared under the direction and in the presence of Fayrene Helper, PA-C.  Electronically Signed: Lyndel Safe, ED Scribe. 02/10/2015. 6:07 PM.    Chief Complaint  Patient presents with  . Urinary Tract Infection   The history is provided by the patient. No language interpreter was used.   HPI Comments: Rebecca Atkinson is a 24 y.o. female, with a PMhx of UTIs, who is currently pregnant, presents to the Emergency Department complaining of dysuria described as burning with urination, urgency, and frequency onset spontaneously 2 days ago and 1 episode of urinary incontinence today while at work. She also notes mild suprapubic abdominal pain and diaphoresis at night.The pt was evaluated in the ED on 10/16 and treated for a possible UTI with pyridium which she was compliant with. She reports her symptoms of dysuria and urgency improved with treatment but reappeared 2 days ago. She has been taking Azo pills without relief. Pt additionally notes white vaginal discharge that is not abnormal for her baseline. She has a history of UTIs following her pregnancy several years ago. Pt is sexually active without protection with a monogamous partner. Since being treated in the ED approximately 1 month ago she missed her last menstrual cycle and has taken 2 home pregnancy tests that were positive. LNMP September.The pt has an appointment for an [place] tomorrow.  No fevers.   History reviewed. No pertinent past medical history. Past Surgical History  Procedure Laterality Date  . Finger surgery     No family history on file. Social History  Substance Use Topics  . Smoking status: Current Every Day Smoker -- 0.25 packs/day    Types: Cigarettes  . Smokeless tobacco: None  . Alcohol Use: Yes     Comment: rare   OB History    Gravida Para Term Preterm AB TAB SAB Ectopic  Multiple Living   1              Review of Systems  Constitutional: Positive for diaphoresis. Negative for fever.  Gastrointestinal: Positive for abdominal pain ( suprapubic ).  Genitourinary: Positive for dysuria, urgency, frequency and vaginal discharge.   Allergies  Review of patient's allergies indicates no known allergies.  Home Medications   Prior to Admission medications   Medication Sig Start Date End Date Taking? Authorizing Provider  acetaminophen (TYLENOL) 500 MG tablet Take 1,000 mg by mouth every 6 (six) hours as needed for mild pain or moderate pain.    Historical Provider, MD  BIOTIN PO Take 1 tablet by mouth daily.    Historical Provider, MD  cyclobenzaprine (FLEXERIL) 10 MG tablet Take 1 tablet (10 mg total) by mouth 3 (three) times daily as needed for muscle spasms. Patient not taking: Reported on 01/17/2015 06/09/14   Mercedes Camprubi-Soms, PA-C  Multiple Vitamin (MULTIVITAMIN WITH MINERALS) TABS tablet Take 1 tablet by mouth daily.    Historical Provider, MD  naproxen (NAPROSYN) 500 MG tablet Take 1 tablet (500 mg total) by mouth 2 (two) times daily with a meal. X 7 days. Then afterwards, you may take 1 tab PO BID PRN pain Patient not taking: Reported on 01/17/2015 06/09/14   Mercedes Camprubi-Soms, PA-C  phenazopyridine (PYRIDIUM) 200 MG tablet Take 1 tablet (200 mg total) by mouth 3 (three) times daily. 01/17/15   Courteney Lyn Mackuen, MD   BP 109/91 mmHg  Pulse 93  Temp(Src) 97.5 F (36.4 C) (Oral)  Resp 20  SpO2 100%  LMP 12/28/2014 Physical Exam  Constitutional: She is oriented to person, place, and time. She appears well-developed and well-nourished. No distress.  HENT:  Head: Normocephalic.  Eyes: Conjunctivae are normal.  Neck: Normal range of motion. Neck supple.  Cardiovascular: Normal rate, regular rhythm and normal heart sounds.   Pulmonary/Chest: Effort normal and breath sounds normal. No respiratory distress. She has no wheezes.  Abdominal:  Soft. Bowel sounds are normal. She exhibits no mass. There is tenderness. There is no rebound and no guarding.  Mild suprapubic tenderness.   Genitourinary:  Pelvic exam: RN in room as chaperone, external female genitalia normal with no signs of lesions or injuries. Speculum exam shows normal cervix with no obvious discharge. Bimanual exam with no adnexal tenderness, no cervical motion tenderness, uterus normal size and nontender, no masses appreciated. The external cervical os is closed.   Musculoskeletal: Normal range of motion.  Neurological: She is alert and oriented to person, place, and time. Coordination normal.  Skin: Skin is warm.  Psychiatric: She has a normal mood and affect. Her behavior is normal.  Nursing note and vitals reviewed.   ED Course  Procedures  DIAGNOSTIC STUDIES: Oxygen Saturation is 100% on RA, normal by my interpretation.    COORDINATION OF CARE: 6:05 PM Pt recent found out she is pregnant.  She also has dysuria.  UA positive for UTI.  Discussed treatment plan with pt at bedside and pt agreed to plan.  6:25 PM Pelvic exam performed with chaperone present throughout entire exam. Mild white vaginal discharge.  No evidence to suggest PID.  Low suspicion for ectopic pregnancy as well.  Pt has an appointment tomorrow to have a formal US and further testing.  Therefore, [place] deferred at this time.     Labs Review Labs Reviewed  WET PREP, GENITAL - Abnormal; Notable for the following:    Clue Cells Wet Prep HPF POC FEW (*)    WBC, Wet Prep HPF POC FEW (*)    All other components within normal limits  HCG, QUANTITATIVE, PREGNANCY - Abnormal; Notable for the following:    hCG, Beta Chain, Quant, S 15097 (*)    All other components within normal limits  URINALYSIS, ROUTINE W REFLEX MICROSCOPIC (NOT AT [facility]) - Abnormal; Notable for the following:    Color, Urine AMBER (*)    APPearance HAZY (*)    Hgb urine dipstick MODERATE (*)    Protein, ur 100 (*)    Nitrite  POSITIVE (*)    Leukocytes, UA LARGE (*)    All other components within normal limits  URINE MICROSCOPIC-ADD ON - Abnormal; Notable for the following:    Squamous Epithelial / LPF FEW (*)    Bacteria, UA FEW (*)    Casts HYALINE CASTS (*)    All other components within normal limits  URINE CULTURE  RPR  HIV ANTIBODY (ROUTINE TESTING)  GC/CHLAMYDIA PROBE AMP ([place]) NOT AT [facility]    Imaging Review No results found. I have personally reviewed and evaluated these images and lab results as part of my medical decision-making.   MDM   Final diagnoses:  UTI in pregnancy, antepartum, first trimester    BP 109/91 mmHg  Pulse 93  Temp(Src) 97.5 F (36.4 C) (Oral)  Resp 20  SpO2 100%  LMP 12/28/2014   I personally performed the services described in this documentation, which was scribed in my presence.  The recorded information has been reviewed and is accurate.      Fayrene HelperBowie Arhaan Chesnut, PA-C 02/10/15 1932  Lyndal Pulleyaniel Knott, MD 02/12/15 1739

## 2015-02-10 NOTE — ED Notes (Signed)
Pt reports urinary frequency, urgency, and dysuria. Also reports positive home pregnancy test. Denies abdominal pain, denies vaginal bleeding/discharge at present.

## 2015-02-10 NOTE — Discharge Instructions (Signed)
Pregnancy and Urinary Tract Infection  A urinary tract infection (UTI) is a bacterial infection of the urinary tract. Infection of the urinary tract can include the ureters, kidneys (pyelonephritis), bladder (cystitis), and urethra (urethritis). All pregnant women should be screened for bacteria in the urinary tract. Identifying and treating a UTI will decrease the risk of preterm labor and developing more serious infections in both the mother and baby.  CAUSES  Bacteria germs cause almost all UTIs.   RISK FACTORS  Many factors can increase your chances of getting a UTI during pregnancy. These include:  · Having a short urethra.  · Poor toilet and hygiene habits.  · Sexual intercourse.  · Blockage of urine along the urinary tract.  · Problems with the pelvic muscles or nerves.  · Diabetes.  · Obesity.  · Bladder problems after having several children.  · Previous history of UTI.  SIGNS AND SYMPTOMS   · Pain, burning, or a stinging feeling when urinating.  · Suddenly feeling the need to urinate right away (urgency).  · Loss of bladder control (urinary incontinence).  · Frequent urination, more than is common with pregnancy.  · Lower abdominal or back discomfort.  · Cloudy urine.  · Blood in the urine (hematuria).  · Fever.   When the kidneys are infected, the symptoms may be:  · Back pain.  · Flank pain on the right side more so than the left.  · Fever.  · Chills.  · Nausea.  · Vomiting.  DIAGNOSIS   A urinary tract infection is usually diagnosed through urine tests. Additional tests and procedures are sometimes done. These may include:  · Ultrasound exam of the kidneys, ureters, bladder, and urethra.  · Looking in the bladder with a lighted tube (cystoscopy).  TREATMENT  Typically, UTIs can be treated with antibiotic medicines.   HOME CARE INSTRUCTIONS   · Only take over-the-counter or prescription medicines as directed by your health care provider. If you were prescribed antibiotics, take them as directed. Finish  them even if you start to feel better.  · Drink enough fluids to keep your urine clear or pale yellow.  · Do not have sexual intercourse until the infection is gone and your health care provider says it is okay.  · Make sure you are tested for UTIs throughout your pregnancy. These infections often come back.   Preventing a UTI in the Future  · Practice good toilet habits. Always wipe from front to back. Use the tissue only once.  · Do not hold your urine. Empty your bladder as soon as possible when the urge comes.  · Do not douche or use deodorant sprays.  · Wash with soap and warm water around the genital area and the anus.  · Empty your bladder before and after sexual intercourse.  · Wear underwear with a cotton crotch.  · Avoid caffeine and carbonated drinks. They can irritate the bladder.  · Drink cranberry juice or take cranberry pills. This may decrease the risk of getting a UTI.  · Do not drink alcohol.  · Keep all your appointments and tests as scheduled.   SEEK MEDICAL CARE IF:   · Your symptoms get worse.  · You are still having fevers 2 or more days after treatment begins.  · You have a rash.  · You feel that you are having problems with medicines prescribed.  · You have abnormal vaginal discharge.  SEEK IMMEDIATE MEDICAL CARE IF:   · You have back or flank   pain.  · You have chills.  · You have blood in your urine.  · You have nausea and vomiting.  · You have contractions of your uterus.  · You have a gush of fluid from the vagina.  MAKE SURE YOU:  · Understand these instructions.    · Will watch your condition.    · Will get help right away if you are not doing well or get worse.       This information is not intended to replace advice given to you by your health care provider. Make sure you discuss any questions you have with your health care provider.     Document Released: 07/15/2010 Document Revised: 01/08/2013 Document Reviewed: 10/17/2012  Elsevier Interactive Patient Education ©2016 [employer]vier  Inc[employer]

## 2015-02-11 LAB — GC/CHLAMYDIA PROBE AMP (~~LOC~~) NOT AT ARMC
Chlamydia: NEGATIVE
Neisseria Gonorrhea: NEGATIVE

## 2015-02-11 LAB — RPR: RPR: NONREACTIVE

## 2015-02-11 LAB — HIV ANTIBODY (ROUTINE TESTING W REFLEX): HIV SCREEN 4TH GENERATION: NONREACTIVE

## 2015-02-12 LAB — URINE CULTURE

## 2015-02-13 ENCOUNTER — Telehealth (HOSPITAL_BASED_OUTPATIENT_CLINIC_OR_DEPARTMENT_OTHER): Payer: Self-pay | Admitting: Emergency Medicine

## 2015-02-13 NOTE — Telephone Encounter (Signed)
Post ED Visit - Positive Culture Follow-up  Culture report reviewed by antimicrobial stewardship pharmacist:  []  Enzo BiNathan Batchelder, Pharm.D. []  Celedonio MiyamotoJeremy Frens, Pharm.D., BCPS []  Garvin FilaMike Maccia, Pharm.D. []  Georgina PillionElizabeth Martin, Pharm.D., BCPS [x]  CarltonMinh Pham, VermontPharm.D., BCPS, AAHIVP []  Estella HuskMichelle Turner, Pharm.D., BCPS, AAHIVP []  Tennis Mustassie Stewart, Pharm.D. []  Sherle Poeob Vincent, [address]Pharm[address]  Positive urine culture E. coli Treated with cephalexin, organism sensitive to the same and no further patient follow-up is required at this time.  Berle MullMiller, Cataleyah Colborn 02/13/2015, 3:03 PM

## 2015-03-14 ENCOUNTER — Emergency Department (HOSPITAL_COMMUNITY)
Admission: EM | Admit: 2015-03-14 | Discharge: 2015-03-14 | Disposition: A | Discharge status: Home / Self Care | Payer: Medicaid Other | Attending: Emergency Medicine | Admitting: Emergency Medicine

## 2015-03-14 ENCOUNTER — Emergency Department (HOSPITAL_COMMUNITY): Payer: Medicaid Other

## 2015-03-14 ENCOUNTER — Encounter (HOSPITAL_COMMUNITY): Payer: Self-pay

## 2015-03-14 DIAGNOSIS — F17210 Nicotine dependence, cigarettes, uncomplicated: Secondary | ICD-10-CM | POA: Diagnosis not present

## 2015-03-14 DIAGNOSIS — N939 Abnormal uterine and vaginal bleeding, unspecified: Secondary | ICD-10-CM

## 2015-03-14 DIAGNOSIS — O209 Hemorrhage in early pregnancy, unspecified: Secondary | ICD-10-CM | POA: Diagnosis present

## 2015-03-14 DIAGNOSIS — Z79899 Other long term (current) drug therapy: Secondary | ICD-10-CM | POA: Diagnosis not present

## 2015-03-14 DIAGNOSIS — Z3A08 8 weeks gestation of pregnancy: Secondary | ICD-10-CM | POA: Diagnosis not present

## 2015-03-14 DIAGNOSIS — O021 Missed abortion: Secondary | ICD-10-CM | POA: Diagnosis not present

## 2015-03-14 DIAGNOSIS — O99331 Smoking (tobacco) complicating pregnancy, first trimester: Secondary | ICD-10-CM | POA: Diagnosis not present

## 2015-03-14 LAB — HCG, QUANTITATIVE, PREGNANCY: hCG, Beta Chain, Quant, S: 7826 m[IU]/mL — ABNORMAL HIGH (ref <5)

## 2015-03-14 LAB — CBC WITH DIFFERENTIAL/PLATELET
Basophils Absolute: 0 10*3/uL (ref 0.0–0.1)
Basophils Relative: 0 %
Eosinophils Absolute: 0.2 10*3/uL (ref 0.0–0.7)
Eosinophils Relative: 3 %
HCT: 36.8 % (ref 36.0–46.0)
Hemoglobin: 12.6 g/dL (ref 12.0–15.0)
Lymphocytes Relative: 25 %
Lymphs Abs: 2.1 10*3/uL (ref 0.7–4.0)
MCH: 31 pg (ref 26.0–34.0)
MCHC: 34.2 g/dL (ref 30.0–36.0)
MCV: 90.4 fL (ref 78.0–100.0)
Monocytes Absolute: 0.7 10*3/uL (ref 0.1–1.0)
Monocytes Relative: 8 %
Neutro Abs: 5.3 10*3/uL (ref 1.7–7.7)
Neutrophils Relative %: 64 %
Platelets: 248 10*3/uL (ref 150–400)
RBC: 4.07 MIL/uL (ref 3.87–5.11)
RDW: 12.9 % (ref 11.5–15.5)
WBC: 8.3 10*3/uL (ref 4.0–10.5)

## 2015-03-14 LAB — ABO/RH: ABO/RH(D): O POS

## 2015-03-14 NOTE — ED Provider Notes (Signed)
CSN: [id number]     Arrival date & time 03/14/15  1629 History   First MD Initiated Contact with Patient 03/14/15 1825     Chief Complaint  Patient presents with  . Vaginal Bleeding    HPI   24 year old [id number] female presents today with vaginal bleeding. Patient reports that her last normal menstrual cycle was on September 26 placing her at 10 weeks 6 days. She reports that she has a significant past medical history of elective abortion at age 24, miscarriage last year, normal vaginal delivery of a girl without competition 2 years prior. She reports that 3 days ago she had very light spotting after having sexual intercourse. She reports that that stopped, had sex again today and had bright red blood after the intercourse that lasted approximately one hour. She reports at the time my evaluation that the bleeding has stopped, she has no abdominal pain, no history of bleeding disorders, no history of ectopic pregnancy, no history of PID or abdominal surgeries, no trauma to the abdomen. Unknown Rh status. She notes that she does not currently have insurance and will at the beginning of January, has not had an ultrasound at this time. She denies any fever, chills, nausea, vomiting, dizziness or lightheadedness, abdominal pain cramping, passage of tissue as it was bright red blood.    History reviewed. No pertinent past medical history. Past Surgical History  Procedure Laterality Date  . Finger surgery     No family history on file. Social History  Substance Use Topics  . Smoking status: Current Every Day Smoker -- 0.25 packs/day    Types: Cigarettes  . Smokeless tobacco: None  . Alcohol Use: Yes     Comment: rare   OB History    Gravida Para Term Preterm AB TAB SAB Ectopic Multiple Living   1              Review of Systems  All other systems reviewed and are negative.   Allergies  Review of patient's allergies indicates no known allergies.  Home Medications   Prior to Admission  medications   Medication Sig Start Date End Date Taking? Authorizing Provider  acetaminophen (TYLENOL) 500 MG tablet Take 1,000 mg by mouth every 6 (six) hours as needed for mild pain or moderate pain.   Yes Historical Provider, MD  cephALEXin (KEFLEX) 500 MG capsule Take 2 capsules (1,000 mg total) by mouth 2 (two) times daily. Patient not taking: Reported on 03/14/2015 02/10/15   Fayrene HelperBowie Tran, PA-C  cyclobenzaprine (FLEXERIL) 10 MG tablet Take 1 tablet (10 mg total) by mouth 3 (three) times daily as needed for muscle spasms. Patient not taking: Reported on 01/17/2015 06/09/14   Mercedes Camprubi-Soms, PA-C  Multiple Vitamin (MULTIVITAMIN WITH MINERALS) TABS tablet Take 1 tablet by mouth daily.    Historical Provider, MD  naproxen (NAPROSYN) 500 MG tablet Take 1 tablet (500 mg total) by mouth 2 (two) times daily with a meal. X 7 days. Then afterwards, you may take 1 tab PO BID PRN pain Patient not taking: Reported on 01/17/2015 06/09/14   Mercedes Camprubi-Soms, PA-C  phenazopyridine (PYRIDIUM) 200 MG tablet Take 1 tablet (200 mg total) by mouth 3 (three) times daily. Patient not taking: Reported on 03/14/2015 01/17/15   Courteney Lyn Mackuen, MD   BP 104/63 mmHg  Pulse 74  Temp(Src) 98.1 F (36.7 C) (Oral)  Resp 16  SpO2 100%  LMP 12/28/2014 (Exact Date)   Physical Exam  Constitutional: She is oriented to person,  place, and time. She appears well-developed and well-nourished.  HENT:  Head: Normocephalic and atraumatic.  Eyes: Conjunctivae are normal. Pupils are equal, round, and reactive to light. Right eye exhibits no discharge. Left eye exhibits no discharge. No scleral icterus.  Neck: Normal range of motion. No JVD present. No tracheal deviation present.  Pulmonary/Chest: Effort normal. No stridor.  Abdominal: Soft. Bowel sounds are normal. She exhibits no distension and no mass. There is no tenderness. There is no rebound and no guarding.  Nongravid appearing abdomen  Genitourinary:  There is no rash, tenderness, lesion or injury on the right labia. There is no rash, tenderness, lesion or injury on the left labia. Cervix exhibits no motion tenderness, no discharge and no friability. Right adnexum displays no mass, no tenderness and no fullness. Left adnexum displays no mass, no tenderness and no fullness. No erythema, tenderness or bleeding in the vagina. No foreign body around the vagina. No signs of injury around the vagina. No vaginal discharge found.  Cervix is closed, no products of conception. One small blood clot in the vaginal vault  Neurological: She is alert and oriented to person, place, and time. Coordination normal.  Psychiatric: She has a normal mood and affect. Her behavior is normal. Judgment and thought content normal.  Nursing note and vitals reviewed.     ED Course  Procedures (including critical care time) Labs Review Labs Reviewed  HCG, QUANTITATIVE, PREGNANCY - Abnormal; Notable for the following:    hCG, Beta Chain, Quant, S 7826 (*)    All other components within normal limits  CBC WITH DIFFERENTIAL/PLATELET  ABO/RH    Imaging Review US Ob Comp Less 14 Wks  03/14/2015  CLINICAL DATA:  Acute onset of vaginal bleeding after intercourse. Initial encounter. EXAM: OBSTETRIC <14 WK [place] AND TRANSVAGINAL OB US TECHNIQUE: Both transabdominal and transvaginal ultrasound examinations were performed for complete evaluation of the gestation as well as the maternal uterus, adnexal regions, and pelvic cul-de-sac. Transvaginal technique was performed to assess early pregnancy. COMPARISON:  None. FINDINGS: Intrauterine gestational sac: Visualized/normal in shape. Yolk sac:  Yes Embryo:  Yes Cardiac Activity: No CRL:  1.9 cm   8 w   3 d                  [place] EDC: 10/21/2015 Maternal uterus/adnexae: No subchorionic hemorrhage is noted. The uterus is otherwise unremarkable. The ovaries are unremarkable in appearance. The right ovary measures 2.9 x 2.3 x 1.6 cm, while the  left ovary measures 2.4 x 2.4 x 2.0 cm. There is no evidence for ovarian torsion. No free fluid is seen within the pelvic cul-de-sac. IMPRESSION: No cardiac activity is visualized, compatible with fetal demise. The crown-rump length is 1.9 cm, reflecting a gestational age of [redacted] weeks 3 days, which is more than 2 weeks delayed from the gestational age by LMP. Electronically Signed   By: Roanna Raider M.D.   On: 03/14/2015 21:33   US Ob Transvaginal  03/14/2015  CLINICAL DATA:  Acute onset of vaginal bleeding after intercourse. Initial encounter. EXAM: OBSTETRIC <14 WK [place] AND TRANSVAGINAL OB US TECHNIQUE: Both transabdominal and transvaginal ultrasound examinations were performed for complete evaluation of the gestation as well as the maternal uterus, adnexal regions, and pelvic cul-de-sac. Transvaginal technique was performed to assess early pregnancy. COMPARISON:  None. FINDINGS: Intrauterine gestational sac: Visualized/normal in shape. Yolk sac:  Yes Embryo:  Yes Cardiac Activity: No CRL:  1.9 cm   8 w   3 d  [place] EDC: 10/21/2015 Maternal uterus/adnexae: No subchorionic hemorrhage is noted. The uterus is otherwise unremarkable. The ovaries are unremarkable in appearance. The right ovary measures 2.9 x 2.3 x 1.6 cm, while the left ovary measures 2.4 x 2.4 x 2.0 cm. There is no evidence for ovarian torsion. No free fluid is seen within the pelvic cul-de-sac. IMPRESSION: No cardiac activity is visualized, compatible with fetal demise. The crown-rump length is 1.9 cm, reflecting a gestational age of [redacted] weeks 3 days, which is more than 2 weeks delayed from the gestational age by LMP. Electronically Signed   By: Roanna Raider M.D.   On: 03/14/2015 21:33   I have personally reviewed and evaluated these images and lab results as part of my medical decision-making.   EKG Interpretation None      MDM   Final diagnoses:  Missed abortion    Labs: HCG, CBC, ABO/Rh- patient is Rh+, hCG Imaging:  US OB complete less than 14 weeks  Consults:  Therapeutics:  Discharge Meds:   Assessment/Plan: A 24 year old female presents today with vaginal bleeding. Patient is by last normal menstrual cycle 10 month 6 days positive. She reports that she does not have insurance and has had no formal physical exam other than pregnancy test. Patient had vaginal bleeding after intercourse, hCG here shows 7826 which is down from the 15,097 done on 02/10/2015. Bedside Doppler showed no cardiac activity. Physical exam showed closed cervix with no products of conception, nontender abdomen. Hemoglobin stable at 12.6. Ultrasound OB showed no cardiac activity compatible with fetal demise the crown and length was 1.9 cm reflecting a gestational age of [redacted] weeks and 3 days. Patient is in no acute distress, on-call OB consult and for management reports that  gestational age less than 9 weeks and patient could most likely be managed outpatient without intervention. Patient is Rh+ no need for treatment. She requested that I box clinic manager at the women's clinic for follow-up evaluation in the next 1-2 days. I personally and [employer] clinic manager requesting scheduled follow-up for patient. Patient was in no acute distress, she had no vaginal bleeding or abdominal pain she has no signs of significant blood loss as evidenced by laboratory data and vital signs. Patient understood today's evaluation and was given strict return precautions the event leading recurred. Patient and her significant other verbalized understanding and agreement for today's plan and had no further questions or concerns at the time of discharge.         Eyvonne Mechanic, PA-C 03/15/15 0145  Alvira Monday, MD 03/15/15 1259

## 2015-03-14 NOTE — Discharge Instructions (Signed)
Please proceed to the emergency room immediately if any new or worsening signs or symptoms present. Please contact women's outpatient clinic as listed above first thing Monday for reevaluation.

## 2015-03-14 NOTE — ED Notes (Signed)
She states she is ! [redacted] weeks gestation and has had some vag. Spotting.  She states she had sexual intercourse today; after which she experienced a burst of heavy vag. Bleeding.  She denies any abd. Pain or cramping and is in no distress.

## 2015-03-16 ENCOUNTER — Encounter: Payer: Self-pay | Admitting: Student

## 2015-03-16 ENCOUNTER — Ambulatory Visit (INDEPENDENT_AMBULATORY_CARE_PROVIDER_SITE_OTHER): Payer: Medicaid Other | Admitting: Student

## 2015-03-16 VITALS — BP 114/70 | HR 86 | Ht 69.0 in | Wt 142.0 lb

## 2015-03-16 DIAGNOSIS — O021 Missed abortion: Secondary | ICD-10-CM | POA: Diagnosis present

## 2015-03-16 NOTE — Patient Instructions (Signed)
Miscarriage  A miscarriage is the sudden loss of an unborn baby (fetus) before the 20th week of pregnancy. Most miscarriages happen in the first 3 months of pregnancy. Sometimes, it happens before a woman even knows she is pregnant. A miscarriage is also called a "spontaneous miscarriage" or "early pregnancy loss." Having a miscarriage can be an emotional experience. Talk with your caregiver about any questions you may have about miscarrying, the grieving process, and your future pregnancy plans.  CAUSES    Problems with the fetal chromosomes that make it impossible for the baby to develop normally. Problems with the baby's genes or chromosomes are most often the result of errors that occur, by chance, as the embryo divides and grows. The problems are not inherited from the parents.   Infection of the cervix or uterus.    Hormone problems.    Problems with the cervix, such as having an incompetent cervix. This is when the tissue in the cervix is not strong enough to hold the pregnancy.    Problems with the uterus, such as an abnormally shaped uterus, uterine fibroids, or congenital abnormalities.    Certain medical conditions.    Smoking, drinking alcohol, or taking illegal drugs.    Trauma.   Often, the cause of a miscarriage is unknown.   SYMPTOMS    Vaginal bleeding or spotting, with or without cramps or pain.   Pain or cramping in the abdomen or lower back.   Passing fluid, tissue, or blood clots from the vagina.  DIAGNOSIS   Your caregiver will perform a physical exam. You may also have an ultrasound to confirm the miscarriage. Blood or urine tests may also be ordered.  TREATMENT    Sometimes, treatment is not necessary if you naturally pass all the fetal tissue that was in the uterus. If some of the fetus or placenta remains in the body (incomplete miscarriage), tissue left behind may become infected and must be removed. Usually, a dilation and curettage (D and C) procedure is performed.  During a D and C procedure, the cervix is widened (dilated) and any remaining fetal or placental tissue is gently removed from the uterus.   Antibiotic medicines are prescribed if there is an infection. Other medicines may be given to reduce the size of the uterus (contract) if there is a lot of bleeding.   If you have Rh negative blood and your baby was Rh positive, you will need a Rh immunoglobulin shot. This shot will protect any future baby from having Rh blood problems in future pregnancies.  HOME CARE INSTRUCTIONS    Your caregiver may order bed rest or may allow you to continue light activity. Resume activity as directed by your caregiver.   Have someone help with home and family responsibilities during this time.    Keep track of the number of sanitary pads you use each day and how soaked (saturated) they are. Write down this information.    Do not use tampons. Do not douche or have sexual intercourse until approved by your caregiver.    Only take over-the-counter or prescription medicines for pain or discomfort as directed by your caregiver.    Do not take aspirin. Aspirin can cause bleeding.    Keep all follow-up appointments with your caregiver.    If you or your partner have problems with grieving, talk to your caregiver or seek counseling to help cope with the pregnancy loss. Allow enough time to grieve before trying to get pregnant again.     SEEK IMMEDIATE MEDICAL CARE IF:    You have severe cramps or pain in your back or abdomen.   You have a fever.   You pass large blood clots (walnut-sized or larger) ortissue from your vagina. Save any tissue for your caregiver to inspect.    Your bleeding increases.    You have a thick, bad-smelling vaginal discharge.   You become lightheaded, weak, or you faint.    You have chills.   MAKE SURE YOU:   Understand these instructions.   Will watch your condition.   Will get help right away if you are not doing well or get worse.     This  information is not intended to replace advice given to you by your health care provider. Make sure you discuss any questions you have with your health care provider.     Document Released: 09/13/2000 Document Revised: 07/15/2012 Document Reviewed: 05/09/2011  Elsevier Interactive Patient Education 2016 [employer]

## 2015-03-16 NOTE — Progress Notes (Signed)
   Subjective:    Patient ID: Rebecca Atkinson, female    DOB: 08/24/1990, 24 y.o.   MRN: [id number]  HPI  Rebecca Atkinson is a 24 y.o. [phone] female who presents to discuss options for missed ab.  [facility]. Ultrasound that day showed fetal demise measuring 8 weeks 3 days.  Per review of the provider note, cervix was closed & patient was deemed stable to discharge home. Sent her for follow up.  Patient denies abdominal pain or vaginal bleeding.   Review of Systems  Constitutional: Negative.   Gastrointestinal: Negative.   Genitourinary: Negative.        Objective:   Physical Exam  Constitutional: She is oriented to person, place, and time. She appears well-developed and well-nourished. No distress.  HENT:  Head: Normocephalic and atraumatic.  Pulmonary/Chest: Effort normal. No respiratory distress.  Abdominal: Soft. There is no tenderness.  Musculoskeletal: Normal range of motion.  Neurological: She is alert and oriented to person, place, and time.  Skin: She is not diaphoretic.  Psychiatric: She has a normal mood and affect. Her behavior is normal. Judgment and thought content normal.   BP 114/70 mmHg  Pulse 86  Ht 5\' 9"  (1.753 m)  Wt 142 lb (64.411 kg)  BMI 20.96 kg/m2  LMP 12/28/2014 (Exact Date)        Assessment & Plan:   Discussed options with patient including expectant management, cytotec placement, and surgery. Patient is requesting surgery at this time. D&E scheduled for 12/15 at 1:30 pm with Dr. Adrian BlackwaterStinson.   1. Missed abortion    - D&E on 12/15  Judeth HornErin Miciah Shealy, NP

## 2015-03-17 ENCOUNTER — Encounter (HOSPITAL_COMMUNITY): Payer: Self-pay

## 2015-03-17 NOTE — Anesthesia Preprocedure Evaluation (Addendum)
Anesthesia Evaluation  Patient identified by MRN, date of birth, ID band Patient awake    Reviewed: Allergy & Precautions, NPO status , Patient's Chart, lab work & pertinent test results  Airway Mallampati: I  TM Distance: >3 FB Neck ROM: Full    Dental  (+) Teeth Intact, Chipped,    Pulmonary Current Smoker,    Pulmonary exam normal breath sounds clear to auscultation       Cardiovascular negative cardio ROS Normal cardiovascular exam Rhythm:Regular Rate:Normal     Neuro/Psych negative neurological ROS  negative psych ROS   GI/Hepatic negative GI ROS, Neg liver ROS,   Endo/Other  negative endocrine ROS  Renal/GU negative Renal ROS  negative genitourinary   Musculoskeletal negative musculoskeletal ROS (+)   Abdominal   Peds  Hematology negative hematology ROS (+)   Anesthesia Other Findings   Reproductive/Obstetrics Missed Ab- 8 weeks                           Anesthesia Physical Anesthesia Plan  ASA: II  Anesthesia Plan: MAC   Post-op Pain Management:    Induction: Intravenous  Airway Management Planned: Mask, Natural Airway and Nasal Cannula  Additional Equipment:   Intra-op Plan:   Post-operative Plan:   Informed Consent: I have reviewed the patients History and Physical, chart, labs and discussed the procedure including the risks, benefits and alternatives for the proposed anesthesia with the patient or authorized representative who has indicated his/her understanding and acceptance.     Plan Discussed with: CRNA, Anesthesiologist and Surgeon  Anesthesia Plan Comments:         Anesthesia Quick Evaluation

## 2015-03-18 ENCOUNTER — Encounter (HOSPITAL_COMMUNITY)
Admission: RE | Disposition: A | Discharge status: Home / Self Care | Payer: Self-pay | Source: Ambulatory Visit | Attending: Family Medicine

## 2015-03-18 ENCOUNTER — Ambulatory Visit (HOSPITAL_COMMUNITY): Payer: Medicaid Other | Admitting: Anesthesiology

## 2015-03-18 ENCOUNTER — Ambulatory Visit (HOSPITAL_COMMUNITY)
Admission: RE | Admit: 2015-03-18 | Discharge: 2015-03-18 | Disposition: A | Discharge status: Home / Self Care | Payer: Medicaid Other | Source: Ambulatory Visit | Attending: Family Medicine | Admitting: Family Medicine

## 2015-03-18 ENCOUNTER — Encounter (HOSPITAL_COMMUNITY): Payer: Self-pay | Admitting: *Deleted

## 2015-03-18 DIAGNOSIS — Z3A08 8 weeks gestation of pregnancy: Secondary | ICD-10-CM | POA: Insufficient documentation

## 2015-03-18 DIAGNOSIS — O021 Missed abortion: Secondary | ICD-10-CM | POA: Diagnosis present

## 2015-03-18 HISTORY — PX: DILATION AND EVACUATION: SHX1459

## 2015-03-18 SURGERY — DILATION AND EVACUATION, UTERUS
Anesthesia: Monitor Anesthesia Care | Site: Vagina

## 2015-03-18 MED ORDER — MIDAZOLAM HCL 5 MG/5ML IJ SOLN
INTRAMUSCULAR | Status: DC | PRN
  Administered 2015-03-18: 2 mg via INTRAVENOUS

## 2015-03-18 MED ORDER — KETOROLAC TROMETHAMINE 30 MG/ML IJ SOLN
INTRAMUSCULAR | Status: AC
  Filled 2015-03-18: qty 1

## 2015-03-18 MED ORDER — FENTANYL CITRATE (PF) 100 MCG/2ML IJ SOLN
INTRAMUSCULAR | Status: AC
  Filled 2015-03-18: qty 2

## 2015-03-18 MED ORDER — MIDAZOLAM HCL 2 MG/2ML IJ SOLN
INTRAMUSCULAR | Status: AC
  Filled 2015-03-18: qty 2

## 2015-03-18 MED ORDER — ONDANSETRON HCL 4 MG/2ML IJ SOLN
INTRAMUSCULAR | Status: DC | PRN
  Administered 2015-03-18: 4 mg via INTRAVENOUS

## 2015-03-18 MED ORDER — LACTATED RINGERS IV SOLN
INTRAVENOUS | Status: DC
  Administered 2015-03-18: 09:00:00 via INTRAVENOUS

## 2015-03-18 MED ORDER — SCOPOLAMINE 1 MG/3DAYS TD PT72: 1.0000 | MEDICATED_PATCH | Freq: Once | TRANSDERMAL | Status: DC

## 2015-03-18 MED ORDER — OXYCODONE-ACETAMINOPHEN 5-325 MG PO TABS: 1.0000 | ORAL_TABLET | ORAL | Status: AC | PRN

## 2015-03-18 MED ORDER — MEPERIDINE HCL 25 MG/ML IJ SOLN: 6.2500 mg | INTRAMUSCULAR | Status: DC | PRN

## 2015-03-18 MED ORDER — DEXAMETHASONE SODIUM PHOSPHATE 10 MG/ML IJ SOLN
INTRAMUSCULAR | Status: AC
  Filled 2015-03-18: qty 1

## 2015-03-18 MED ORDER — KETOROLAC TROMETHAMINE 30 MG/ML IJ SOLN
INTRAMUSCULAR | Status: DC | PRN
  Administered 2015-03-18: 30 mg via INTRAVENOUS

## 2015-03-18 MED ORDER — ONDANSETRON HCL 4 MG/2ML IJ SOLN
INTRAMUSCULAR | Status: AC
  Filled 2015-03-18: qty 2

## 2015-03-18 MED ORDER — HYDROCODONE-ACETAMINOPHEN 7.5-325 MG PO TABS: 1.0000 | ORAL_TABLET | Freq: Once | ORAL | Status: DC | PRN

## 2015-03-18 MED ORDER — LACTATED RINGERS IV SOLN: INTRAVENOUS | Status: DC

## 2015-03-18 MED ORDER — PROPOFOL 10 MG/ML IV BOLUS
INTRAVENOUS | Status: DC | PRN
Start: 2015-03-18 — End: 2015-03-18
  Administered 2015-03-18 (×2): 20 mg via INTRAVENOUS
  Administered 2015-03-18: 30 mg via INTRAVENOUS
  Administered 2015-03-18: 20 mg via INTRAVENOUS
  Administered 2015-03-18: 30 mg via INTRAVENOUS
  Administered 2015-03-18 (×2): 20 mg via INTRAVENOUS

## 2015-03-18 MED ORDER — PROMETHAZINE HCL 25 MG/ML IJ SOLN: 6.2500 mg | INTRAMUSCULAR | Status: DC | PRN

## 2015-03-18 MED ORDER — BUPIVACAINE-EPINEPHRINE 0.25% -1:200000 IJ SOLN
INTRAMUSCULAR | Status: DC | PRN
  Administered 2015-03-18: 10 mL

## 2015-03-18 MED ORDER — LIDOCAINE HCL (CARDIAC) 20 MG/ML IV SOLN
INTRAVENOUS | Status: AC
Start: 2015-03-18 — End: 2015-03-18
  Filled 2015-03-18: qty 5

## 2015-03-18 MED ORDER — BUPIVACAINE HCL (PF) 0.25 % IJ SOLN
INTRAMUSCULAR | Status: AC
  Filled 2015-03-18: qty 30

## 2015-03-18 MED ORDER — FENTANYL CITRATE (PF) 100 MCG/2ML IJ SOLN
INTRAMUSCULAR | Status: DC | PRN
  Administered 2015-03-18 (×2): 50 ug via INTRAVENOUS

## 2015-03-18 MED ORDER — FENTANYL CITRATE (PF) 100 MCG/2ML IJ SOLN
25.0000 ug | INTRAMUSCULAR | Status: DC | PRN
  Administered 2015-03-18 (×2): 50 ug via INTRAVENOUS

## 2015-03-18 MED ORDER — DEXAMETHASONE SODIUM PHOSPHATE 4 MG/ML IJ SOLN
INTRAMUSCULAR | Status: DC | PRN
  Administered 2015-03-18: 4 mg via INTRAVENOUS

## 2015-03-18 MED ORDER — IBUPROFEN 800 MG PO TABS: 800.0000 mg | ORAL_TABLET | Freq: Three times a day (TID) | ORAL | Status: AC

## 2015-03-18 MED ORDER — LIDOCAINE HCL (CARDIAC) 20 MG/ML IV SOLN
INTRAVENOUS | Status: DC | PRN
  Administered 2015-03-18: 50 mg via INTRAVENOUS

## 2015-03-18 MED ORDER — PROPOFOL 10 MG/ML IV BOLUS
INTRAVENOUS | Status: AC
  Filled 2015-03-18: qty 20

## 2015-03-18 SURGICAL SUPPLY — 20 items
CATH ROBINSON RED A/P 16FR (CATHETERS) ×3 IMPLANT
CLOTH BEACON ORANGE TIMEOUT ST (SAFETY) ×3 IMPLANT
DECANTER SPIKE VIAL GLASS SM (MISCELLANEOUS) ×3 IMPLANT
GLOVE BIOGEL PI IND STRL 7.0 (GLOVE) ×1 IMPLANT
GLOVE BIOGEL PI IND STRL 7.5 (GLOVE) ×1 IMPLANT
GLOVE BIOGEL PI INDICATOR 7.0 (GLOVE) ×2
GLOVE BIOGEL PI INDICATOR 7.5 (GLOVE) ×2
GLOVE ECLIPSE 7.5 STRL STRAW (GLOVE) ×6 IMPLANT
GOWN STRL REUS W/TWL LRG LVL3 (GOWN DISPOSABLE) ×9 IMPLANT
KIT BERKELEY 1ST TRIMESTER 3/8 (MISCELLANEOUS) ×3 IMPLANT
NS IRRIG 1000ML POUR BTL (IV SOLUTION) ×3 IMPLANT
PACK VAGINAL MINOR WOMEN LF (CUSTOM PROCEDURE TRAY) ×3 IMPLANT
PAD OB MATERNITY 4.3X12.25 (PERSONAL CARE ITEMS) ×3 IMPLANT
PAD PREP 24X48 CUFFED NSTRL (MISCELLANEOUS) ×3 IMPLANT
SET BERKELEY SUCTION TUBING (SUCTIONS) ×3 IMPLANT
TOWEL OR 17X24 6PK STRL BLUE (TOWEL DISPOSABLE) ×6 IMPLANT
VACURETTE 10 RIGID CVD (CANNULA) IMPLANT
VACURETTE 7MM CVD STRL WRAP (CANNULA) IMPLANT
VACURETTE 8 RIGID CVD (CANNULA) ×3 IMPLANT
VACURETTE 9 RIGID CVD (CANNULA) IMPLANT

## 2015-03-18 NOTE — Op Note (Signed)
[place] PROCEDURE DATE: 03/18/2015  PREOPERATIVE DIAGNOSIS: 8 week missed abortion. POSTOPERATIVE DIAGNOSIS: The same. PROCEDURE:     Dilation and Evacuation. SURGEON:  Dr. Candelaria CelesteJacob Larnce Schnackenberg  INDICATIONS: 24 y.o. G4P1021with MAB at [redacted] weeks gestation, needing surgical completion.  Risks of surgery were discussed with the patient including but not limited to: bleeding which may require transfusion; infection which may require antibiotics; injury to uterus or surrounding organs;need for additional procedures including laparotomy or laparoscopy; possibility of intrauterine scarring which may impair future fertility; and other postoperative/anesthesia complications. Written informed consent was obtained.    FINDINGS:  A 8 week size anteverted uterus, moderate amounts of products of conception, specimen sent to pathology.  ANESTHESIA:    Monitored intravenous sedation, paracervical block. INTRAVENOUS FLUIDS:  500 ml of LR ESTIMATED BLOOD LOSS:  Less than 20 ml. SPECIMENS:  Products of conception sent to pathology COMPLICATIONS:  None immediate.  PROCEDURE DETAILS:  The patient received intravenous antibiotics while in the preoperative area.  She was then taken to the operating room where general anesthesia was administered and was found to be adequate.  After an adequate timeout was performed, she was placed in the dorsal lithotomy position and examined; then prepped and draped in the sterile manner.   Her bladder was catheterized for an unmeasured amount of clear, yellow urine. A vaginal speculum was then placed in the patient's vagina and a paracervical block using 1% Marcaine was administered.  A single tooth tenaculum was then applied to the anterior lip of the cervix. The cervix was gently dilated to accommodate a 8 mm suction curette that was gently advanced to the uterine fundus.  The suction device was then activated and curette slowly rotated to clear the uterus of products of conception.  A  sharp curettage was then performed to confirm completer emptying of the uterus.There was minimal bleeding noted and the tenaculum removed with good hemostasis noted.  The patient tolerated the procedure well.  The patient was taken to the recovery area in stable condition.   Levie HeritageJacob J Pratik Dalziel, DO 03/18/2015 10:51 AM

## 2015-03-18 NOTE — Discharge Instructions (Signed)
D&E (Dilation and Evacuation) Dilation and evacuation (D&E) is a minor operation. It involves stretching (dilation) the cervix and evacuation of the uterus. During the procedure, the cervix is dilated and tissue is gently suctioned from the inside of the uterus.  REASONS FOR DOING D&E Removal of retained placenta after giving birth.  Abortion. Miscarriage.  RISKS AND COMPLICATIONS Putting a hole (perforation) in the uterus. Excessive bleeding after the D&E.  Infection of the uterus.  Damage to the cervix.  Developing scar tissue (adhesions) inside the uterus, later causing abnormal bleeding or no monthly bleeding (amenorrhea) or problems with fertility. Complications from general or local anesthetic.     PROCEDURE You may be given a drug to make you sleep (general anesthetic) or a drug that numbs the area (local anesthetic) in and around the cervix.  You will lie on your back with your legs in stirrups.  A curved tool (suction curette) will be used to evacuate the uterus and will then be removed.  This usually takes around 15 to 30 minutes.  AFTER THE PROCEDURE You will rest in the recovery room until you are stable and feel ready to go home.  You may feel sick to your stomach (nauseous) or throw up (vomit) if you had general anesthesia.  You may have light cramping and bleeding for a couple days to 2 weeks after the procedure.  Your uterus needs to make new lining after a D&E. This may make your next period late.   HOME CARE INSTRUCTIONS Do not drive for 24 hours.  Wait 1 week before returning to strenuous activities.  You may resume your usual diet.  Drink enough water and fluids to keep your urine clear or pale yellow.  You should return to your usual bowel function. If constipation occurs, you may:  Take a mild laxative with permission from your caregiver.  Add fruit and bran to your diet.  Take showers instead of baths for two weeks Do not go swimming or use a hot tub until  your caregiver gives you permission.  Have someone with you or available for you the first 24 to 48 hours, especially if you had a general anesthetic.  Do not douche, use tampons, or have intercourse until after your follow-up appointment, or when your caregiver approves.  Only take over-the-counter or prescription medicines for pain, discomfort, or fever as directed by your caregiver. Do not take aspirin. It can cause bleeding.  If a prescription has been given to you, follow your caregiver's directions. You may be given a medicine that kills germs (antibiotic) to prevent an infection.  Keep all your follow-up appointments recommended by your caregiver.   SEEK MEDICAL CARE IF: You have increasing cramps or pain not relieved with medicine.  You develop belly (abdominal) pain, which does not seem to be related to the same area as your earlier cramping and pain.  You feel dizzy or feel like fainting.  You have a bad smelling vaginal discharge.  You develop a rash.  You develop a reaction or allergy to your medicine.   SEEK IMMEDIATE MEDICAL CARE IF: Bleeding is heavier than a normal menstrual period.  You have an oral temperature above 101F, not controlled by medicine.  You develop chest pain.  You develop shortness of breath.  You pass out.  You develop heavy vaginal bleeding with or without blood clots.   MAKE SURE YOU: Understand these instructions.  Will watch your condition.  Will get help right away if you are   shortness of breath.  °· You pass out.  °· You develop heavy vaginal bleeding with or without blood clots.  ° °MAKE SURE YOU: °· Understand these instructions.  °· Will watch your condition.  °· Will get help right away if you are not doing well or get worse.  ° °UPDATED HEALTH PRACTICES °· A Pap smear is done to screen for cervical cancer.  °· The first Pap smear should be done at age 21.  °· Between ages 21 and 29, Pap smears are repeated every 2 years.  °· Beginning at age 30, you are advised to have a Pap smear every 3 years as long as your past 3 Pap smears have been normal.  °· Some women have medical problems  that increase the chance of getting cervical cancer. Talk to your caregiver about these problems. It is especially important to talk to your caregiver if a new problem develops soon after your last Pap smear. In these cases, your caregiver may recommend more frequent screening and Pap smears.  °· The above recommendations are the same for women who have or have not gotten the vaccine for HPV (human papillomavirus).  °· If you had a uterus removal (hysterectomy) for a problem that was not a cancer or a condition that could lead to cancer, then you no longer need Pap smears.  °· If you are between ages 65 and 70, and you have had normal Pap smears going back 10 years, you no longer need Pap smears.  °· If you have had past treatment for cervical cancer or a condition that could lead to cancer, you need Pap smears and screening for cancer for at least 20 years after your treatment.  °· Continue monthly breast self-examinations. Your caregiver can provide information and instructions for breast self-examination.  °[employer]® Patient Information ©2011 [employer]. ° ° ° ° °Post Anesthesia Home Care Instructions ° °Activity: °Get plenty of rest for the remainder of the day. A responsible adult should stay with you for 24 hours following the procedure.  °For the next 24 hours, DO NOT: °-Drive a car °-Operate machinery °-Drink alcoholic beverages °-Take any medication unless instructed by your physician °-Make any legal decisions or sign important papers. ° °Meals: °Start with liquid foods such as gelatin or soup. Progress to regular foods as tolerated. Avoid greasy, spicy, heavy foods. If nausea and/or vomiting occur, drink only clear liquids until the nausea and/or vomiting subsides. Call your physician if vomiting continues. ° °Special Instructions/Symptoms: °Your throat may feel dry or sore from the anesthesia or the breathing tube placed in your throat during surgery. If this causes discomfort, gargle with warm salt  water. The discomfort should disappear within 24 hours. ° °If you had a scopolamine patch placed behind your ear for the management of post- operative nausea and/or vomiting: ° °1. The medication in the patch is effective for 72 hours, after which it should be removed.  Wrap patch in a tissue and discard in the trash. Wash hands thoroughly with soap and water. °2. You may remove the patch earlier than 72 hours if you experience unpleasant side effects which may include dry mouth, dizziness or visual disturbances. °3. Avoid touching the patch. Wash your hands with soap and water after contact with the patch. °  ° ° ° °

## 2015-03-18 NOTE — Transfer of Care (Signed)
Immediate Anesthesia Transfer of Care Note  Patient: Rebecca Atkinson  Procedure(s) Performed: Procedure(s): DILATATION AND EVACUATION (N/A)  Patient Location: PACU  Anesthesia Type:MAC  Level of Consciousness: awake, alert  and oriented  Airway & Oxygen Therapy: Patient Spontanous Breathing and Patient connected to nasal cannula oxygen  Post-op Assessment: Report given to RN  Post vital signs: Reviewed  Last Vitals:  Filed Vitals:   03/18/15 0906  BP: 110/76  Pulse: 83  Temp: 36.4 C  Resp: 20    Complications: No apparent anesthesia complications

## 2015-03-18 NOTE — Anesthesia Postprocedure Evaluation (Signed)
Anesthesia Post Note  Patient: Rebecca Atkinson  Procedure(s) Performed: Procedure(s) (LRB): DILATATION AND EVACUATION (N/A)  Patient location during evaluation: PACU Anesthesia Type: MAC Level of consciousness: awake and alert and oriented Pain management: pain level controlled Vital Signs Assessment: post-procedure vital signs reviewed and stable Respiratory status: spontaneous breathing, respiratory function stable and nonlabored ventilation Cardiovascular status: blood pressure returned to baseline Postop Assessment: no signs of nausea or vomiting Anesthetic complications: no    Last Vitals:  Filed Vitals:   03/18/15 1130 03/18/15 1145  BP: 108/69 105/63  Pulse: 74 70  Temp:    Resp: 16 13    Last Pain:  Filed Vitals:   03/18/15 1209  PainSc: 2                  Kole Hilyard A.

## 2015-03-18 NOTE — H&P (Signed)
Preoperative History and Physical  Rebecca Atkinson is a 24 y.o. [phone] here for surgical management of missed AB.   No significant preoperative concerns.  Proposed surgery: Dilation and evacuation  Past Medical History  Diagnosis Date  . HPV in female    Past Surgical History  Procedure Laterality Date  . Finger surgery     OB History    Gravida Para Term Preterm AB TAB SAB Ectopic Multiple Living   4 1 1  2 1 1   1      Patient denies any cervical dysplasia or STIs. No current facility-administered medications on file prior to encounter.   Current Outpatient Prescriptions on File Prior to Encounter  Medication Sig Dispense Refill  . acetaminophen (TYLENOL) 500 MG tablet Take 1,000 mg by mouth every 6 (six) hours as needed for mild pain or moderate pain.    . cephALEXin (KEFLEX) 500 MG capsule Take 2 capsules (1,000 mg total) by mouth 2 (two) times daily. (Patient not taking: Reported on 03/14/2015) 40 capsule 0  . cyclobenzaprine (FLEXERIL) 10 MG tablet Take 1 tablet (10 mg total) by mouth 3 (three) times daily as needed for muscle spasms. (Patient not taking: Reported on 01/17/2015) 15 tablet 0  . Multiple Vitamin (MULTIVITAMIN WITH MINERALS) TABS tablet Take 1 tablet by mouth daily.    . naproxen (NAPROSYN) 500 MG tablet Take 1 tablet (500 mg total) by mouth 2 (two) times daily with a meal. X 7 days. Then afterwards, you may take 1 tab PO BID PRN pain (Patient not taking: Reported on 01/17/2015) 30 tablet 0  . phenazopyridine (PYRIDIUM) 200 MG tablet Take 1 tablet (200 mg total) by mouth 3 (three) times daily. (Patient not taking: Reported on 03/14/2015) 6 tablet 0   No Known Allergies Social History:   reports that she has been smoking Cigarettes.  She has a 2.5 pack-year smoking history. She has never used smokeless tobacco. She reports that she drinks alcohol. She reports that she does not use illicit drugs.  History reviewed. No pertinent family history.  Review of  Systems: Full 10 systems review of systems preformed, which were normal other than what was stated in the HPI.  PHYSICAL EXAM: Blood pressure 110/76, pulse 83, temperature 97.6 F (36.4 C), temperature source Oral, resp. rate 20, last menstrual period 12/28/2014, SpO2 100 %. General appearance - alert, well appearing, and in no distress Head - Normocephalic, atraumatic.  Right and left external ears normal. Eyes - EOMI.  Nonicteric.  Normal conjunctiva Neck - supple, no lymphadenopathy.  No tracheal deviation Chest - clear to auscultation, no wheezes, rales or rhonchi, symmetric air entry Heart - normal rate and regular rhythm Abdomen - soft, nontender, nondistended, no masses or organomegaly Pelvic - examination not indicated Extremities - peripheral pulses normal, no pedal edema, no clubbing or cyanosis Skin - Warm to touch. no bruises, rashes, wounds. Neuro - Oriented x3.  Cranial nerves intact. Psych - normal thought process.  Judgement intact.  Labs: Results for orders placed or performed during the hospital encounter of 03/14/15 (from the past 336 hour(s))  hCG, quantitative, pregnancy   Collection Time: 03/14/15  7:41 PM  Result Value Ref Range   hCG, Beta Chain, Quant, S 7826 (H) <5 mIU/mL  CBC with Differential   Collection Time: 03/14/15  7:41 PM  Result Value Ref Range   WBC 8.3 4.0 - 10.5 K/uL   RBC 4.07 3.87 - 5.11 MIL/uL   Hemoglobin 12.6 12.0 - 15.0 g/dL  HCT 36.8 36.0 - 46.0 %   MCV 90.4 78.0 - 100.0 fL   MCH 31.0 26.0 - 34.0 pg   MCHC 34.2 30.0 - 36.0 g/dL   RDW 16.1 09.6 - 04.5 %   Platelets 248 150 - 400 K/uL   Neutrophils Relative % 64 %   Neutro Abs 5.3 1.7 - 7.7 K/uL   Lymphocytes Relative 25 %   Lymphs Abs 2.1 0.7 - 4.0 K/uL   Monocytes Relative 8 %   Monocytes Absolute 0.7 0.1 - 1.0 K/uL   Eosinophils Relative 3 %   Eosinophils Absolute 0.2 0.0 - 0.7 K/uL   Basophils Relative 0 %   Basophils Absolute 0.0 0.0 - 0.1 K/uL  ABO/Rh   Collection Time:  03/16/15  8:11 PM  Result Value Ref Range   ABO/RH(D) O POS    No rh immune globuloin NOT A RH IMMUNE GLOBULIN CANDIDATE, PT RH POSITIVE     Imaging Studies: US Ob Comp Less 14 Wks  2015-03-16  CLINICAL DATA:  Acute onset of vaginal bleeding after intercourse. Initial encounter. EXAM: OBSTETRIC <14 WK [place] AND TRANSVAGINAL OB US TECHNIQUE: Both transabdominal and transvaginal ultrasound examinations were performed for complete evaluation of the gestation as well as the maternal uterus, adnexal regions, and pelvic cul-de-sac. Transvaginal technique was performed to assess early pregnancy. COMPARISON:  None. FINDINGS: Intrauterine gestational sac: Visualized/normal in shape. Yolk sac:  Yes Embryo:  Yes Cardiac Activity: No CRL:  1.9 cm   8 w   3 d                  [place] EDC: 10/21/2015 Maternal uterus/adnexae: No subchorionic hemorrhage is noted. The uterus is otherwise unremarkable. The ovaries are unremarkable in appearance. The right ovary measures 2.9 x 2.3 x 1.6 cm, while the left ovary measures 2.4 x 2.4 x 2.0 cm. There is no evidence for ovarian torsion. No free fluid is seen within the pelvic cul-de-sac. IMPRESSION: No cardiac activity is visualized, compatible with fetal demise. The crown-rump length is 1.9 cm, reflecting a gestational age of [redacted] weeks 3 days, which is more than 2 weeks delayed from the gestational age by LMP. Electronically Signed   By: Roanna Raider M.D.   On: 2015/03/16 21:33   US Ob Transvaginal  March 16, 2015  CLINICAL DATA:  Acute onset of vaginal bleeding after intercourse. Initial encounter. EXAM: OBSTETRIC <14 WK [place] AND TRANSVAGINAL OB US TECHNIQUE: Both transabdominal and transvaginal ultrasound examinations were performed for complete evaluation of the gestation as well as the maternal uterus, adnexal regions, and pelvic cul-de-sac. Transvaginal technique was performed to assess early pregnancy. COMPARISON:  None. FINDINGS: Intrauterine gestational sac: Visualized/normal in  shape. Yolk sac:  Yes Embryo:  Yes Cardiac Activity: No CRL:  1.9 cm   8 w   3 d                  [place] EDC: 10/21/2015 Maternal uterus/adnexae: No subchorionic hemorrhage is noted. The uterus is otherwise unremarkable. The ovaries are unremarkable in appearance. The right ovary measures 2.9 x 2.3 x 1.6 cm, while the left ovary measures 2.4 x 2.4 x 2.0 cm. There is no evidence for ovarian torsion. No free fluid is seen within the pelvic cul-de-sac. IMPRESSION: No cardiac activity is visualized, compatible with fetal demise. The crown-rump length is 1.9 cm, reflecting a gestational age of [redacted] weeks 3 days, which is more than 2 weeks delayed from the gestational age by LMP. Electronically Signed  By: Roanna Raider M.D.   On: 03/14/2015 21:33    Assessment: There are no active problems to display for this patient.   Plan: Patient will undergo surgical management with dilation and evacuation.   The risks of surgery were discussed in detail with the patient including but not limited to: bleeding which may require transfusion or reoperation; infection which may require antibiotics; injury to surrounding organs which may involve bowel, bladder, ureters ; need for additional procedures including laparoscopy or laparotomy; thromboembolic phenomenon, surgical site problems and other postoperative/anesthesia complications. Likelihood of success in alleviating the patient's condition was discussed. Routine postoperative instructions will be reviewed with the patient and her family in detail after surgery.  The patient concurred with the proposed plan, giving informed written consent for the surgery.  Patient has been NPO since last night she will remain NPO for procedure.  Anesthesia and OR aware.  Preoperative prophylactic antibiotics not indicated and SCDs ordered on call to the OR.  To OR when ready.  Levie Heritage, DO  03/18/2015, 9:22 AM

## 2015-03-19 ENCOUNTER — Encounter (HOSPITAL_COMMUNITY): Payer: Self-pay | Admitting: Family Medicine

## 2015-04-08 ENCOUNTER — Ambulatory Visit: Payer: Medicaid Other | Admitting: Family Medicine

## 2015-12-12 ENCOUNTER — Emergency Department (HOSPITAL_COMMUNITY)
Admission: EM | Admit: 2015-12-12 | Discharge: 2015-12-12 | Disposition: A | Discharge status: Home / Self Care | Payer: Medicaid Other | Attending: Emergency Medicine | Admitting: Emergency Medicine

## 2015-12-12 ENCOUNTER — Encounter (HOSPITAL_COMMUNITY): Payer: Self-pay | Admitting: Emergency Medicine

## 2015-12-12 DIAGNOSIS — F17210 Nicotine dependence, cigarettes, uncomplicated: Secondary | ICD-10-CM | POA: Diagnosis not present

## 2015-12-12 DIAGNOSIS — Z79899 Other long term (current) drug therapy: Secondary | ICD-10-CM | POA: Diagnosis not present

## 2015-12-12 DIAGNOSIS — K047 Periapical abscess without sinus: Secondary | ICD-10-CM | POA: Insufficient documentation

## 2015-12-12 DIAGNOSIS — K0889 Other specified disorders of teeth and supporting structures: Secondary | ICD-10-CM | POA: Diagnosis present

## 2015-12-12 MED ORDER — TRAMADOL HCL 50 MG PO TABS: 50.0000 mg | ORAL_TABLET | Freq: Four times a day (QID) | ORAL | 0 refills | Status: AC | PRN

## 2015-12-12 MED ORDER — BUPIVACAINE-EPINEPHRINE (PF) 0.5% -1:200000 IJ SOLN
1.8000 mL | Freq: Once | INTRAMUSCULAR | Status: AC
  Administered 2015-12-12: 1.8 mL
  Filled 2015-12-12: qty 1.8

## 2015-12-12 MED ORDER — PENICILLIN V POTASSIUM 500 MG PO TABS: 500.0000 mg | ORAL_TABLET | Freq: Four times a day (QID) | ORAL | 0 refills | Status: AC

## 2015-12-12 NOTE — ED Provider Notes (Signed)
[facility]-EMERGENCY DEPT Provider Note   CSN: [id number] Arrival date & time: 12/12/15  4098  By signing my name below, I, Soijett Blue, attest that this documentation has been prepared under the direction and in the presence of Santiago Glad, PA-C Electronically Signed: Soijett Blue, ED Scribe. 12/12/15. 7:28 PM.   History   Chief Complaint Chief Complaint  Patient presents with  . Dental Pain  . Sore Throat    HPI Rebecca Atkinson is a 25 y.o. female who presents to the Emergency Department complaining of right lower back dental pain onset 1 month ago worsening 2-3 days ago. Pt reports that she has a broken tooth to the area and she pressed on her gum last night and there was green pus drainage. Pt reports that she had dental extractions last year and was due for a root canal, but she was opting for a cap to the tooth prior to her current symptoms. Pt denies being seen for the dental cap prior to her current symptoms. Pt notes that she does have a dentist. She states that she is having associated symptoms of  green pus from right lower gum, and appetite change due to pain. She states that she has tried Advil with mild relief for her symptoms. She denies fever, chills, trouble swallowing, neck pain, neck stiffness, and any other symptoms.     The history is provided by the patient. No language interpreter was used.    Past Medical History:  Diagnosis Date  . HPV in female     There are no active problems to display for this patient.   Past Surgical History:  Procedure Laterality Date  . DILATION AND EVACUATION N/A 03/18/2015   Procedure: DILATATION AND EVACUATION;  Surgeon: Levie Heritage, DO;  Location: [facility] ORS;  Service: Gynecology;  Laterality: N/A;  . FINGER SURGERY      OB History    Gravida Para Term Preterm AB Living   4 1 1   2 1    SAB TAB Ectopic Multiple Live Births   1 1     1        Home Medications    Prior to Admission medications   Medication Sig Start  Date End Date Taking? Authorizing Provider  acetaminophen (TYLENOL) 500 MG tablet Take 1,000 mg by mouth every 6 (six) hours as needed for mild pain or moderate pain.    Historical Provider, MD  ibuprofen (ADVIL,MOTRIN) 800 MG tablet Take 1 tablet (800 mg total) by mouth 3 (three) times daily. 03/18/15   Levie Heritage, DO  Multiple Vitamin (MULTIVITAMIN WITH MINERALS) TABS tablet Take 1 tablet by mouth daily.    Historical Provider, MD  oxyCODONE-acetaminophen (ROXICET) 5-325 MG tablet Take 1 tablet by mouth every 4 (four) hours as needed for severe pain. 03/18/15   Levie Heritage, DO    Family History No family history on file.  Social History Social History  Substance Use Topics  . Smoking status: Current Every Day Smoker    Packs/day: 0.25    Years: 10.00    Types: Cigarettes  . Smokeless tobacco: Never Used  . Alcohol use Yes     Comment: rare     Allergies   Review of patient's allergies indicates no known allergies.   Review of Systems Review of Systems  Constitutional: Negative for chills and fever.  HENT: Positive for dental problem (right lower). Negative for trouble swallowing.        Drainage from right lower gum  Physical Exam Updated Vital Signs BP 115/72 (BP Location: Right Arm)   Pulse 79   Temp 98.1 F (36.7 C) (Oral)   Resp 18   Ht 5\' 9"  (1.753 m)   Wt 135 lb (61.2 kg)   LMP 11/22/2014   SpO2 100%   BMI 19.94 kg/m   Physical Exam  Constitutional: She is oriented to person, place, and time. She appears well-developed and well-nourished. No distress.  HENT:  Head: Normocephalic and atraumatic.  Mouth/Throat: Uvula is midline, oropharynx is clear and moist and mucous membranes are normal. No trismus in the jaw. Dental abscesses present.  TTP and swelling of right lower ginigva. 0.5 cm abscess of the right lower gingiva.  No drainage or sublingual tenderness or swelling. No trismus.   Eyes: EOM are normal.  Neck: Neck supple.    Cardiovascular: Normal rate, regular rhythm and normal heart sounds.  Exam reveals no gallop and no friction rub.   No murmur heard. Pulmonary/Chest: Effort normal and breath sounds normal. No respiratory distress. She has no wheezes. She has no rales.  Musculoskeletal: Normal range of motion.  Lymphadenopathy:       Head (right side): No submental and no submandibular adenopathy present.       Head (left side): No submental and no submandibular adenopathy present.  No submandibular or submental LAD.   Neurological: She is alert and oriented to person, place, and time.  Skin: Skin is warm and dry.  Psychiatric: She has a normal mood and affect. Her behavior is normal.  Nursing note and vitals reviewed.    ED Treatments / Results  DIAGNOSTIC STUDIES: Oxygen Saturation is 100% on RA, nl by my interpretation.    COORDINATION OF CARE: 7:25 PM Discussed treatment plan with pt at bedside which includes dental block, I&D, abx Rx, referral and follow up with dentist and pt agreed to plan.   Procedures .Marland KitchenIncision and Drainage Date/Time: 12/12/2015 8:02 PM Performed by: Santiago Glad Authorized by: Santiago Glad   Consent:    Consent obtained:  Verbal   Consent given by:  Patient   Risks discussed:  Infection   Alternatives discussed:  No treatment Location:    Type:  Abscess   Size:  0.5 cm   Location:  Mouth   Mouth location: right lower gumline. Anesthesia (see MAR for exact dosages):    Anesthesia method:  Local infiltration   Local anesthetic:  Bupivacaine 0.5% WITH epi (1.8 ml) Procedure type:    Complexity:  Simple Procedure details:    Needle aspiration: no     Incision types:  Stab incision   Scalpel blade:  11   Wound management:  Probed and deloculated   Drainage:  Purulent   Drainage amount:  Scant   Wound treatment:  Wound left open   Packing materials:  None Post-procedure details:    Patient tolerance of procedure:  Tolerated well, no immediate  complications     (including critical care time)  Medications Ordered in ED Medications - No data to display   Initial Impression / Assessment and Plan / ED Course  I have reviewed the triage vital signs and the nursing notes.   Clinical Course    Patient with dentalgia.  She did have an abscess that was incised and drained in the ED.  Exam not concerning for Ludwig's angina or pharyngeal abscess.  Will treat with PCN and Naproxen. Pt instructed to follow-up with dentist.  Discussed return precautions. Pt safe for discharge.  Final Clinical Impressions(s) / ED Diagnoses   Final diagnoses:  None    New Prescriptions New Prescriptions   No medications on file   I personally performed the services described in this documentation, which was scribed in my presence. The recorded information has been reviewed and is accurate.     Santiago GladHeather Jireh Vinas, PA-C 12/14/15 2155    Mancel BaleElliott Wentz, MD 12/15/15 [phone]

## 2015-12-12 NOTE — ED Triage Notes (Signed)
Patient c/o right posterior dental pain from cracked tooth and right sided sore throat.  Patient hasnt been feeling like eating or drinking over the past couple days.

## 2016-02-02 DEATH — deceased

## 2017-04-13 IMAGING — US US OB COMP LESS 14 WK
1 series · 14 of 22 positions shown · non-contrast
Comparison: None.

CLINICAL DATA: Acute onset of vaginal bleeding after intercourse.
Initial encounter.

EXAM:
OBSTETRIC <14 WK US AND TRANSVAGINAL OB US
TECHNIQUE: Both transabdominal and transvaginal ultrasound examinations were
performed for complete evaluation of the gestation as well as the
maternal uterus, adnexal regions, and pelvic cul-de-sac.
Transvaginal technique was performed to assess early pregnancy.

[Series 1: us ob comp less 14 wk · 0.22mm/px · 22 acquisitions, 14 frames shown]
[im 1/22]
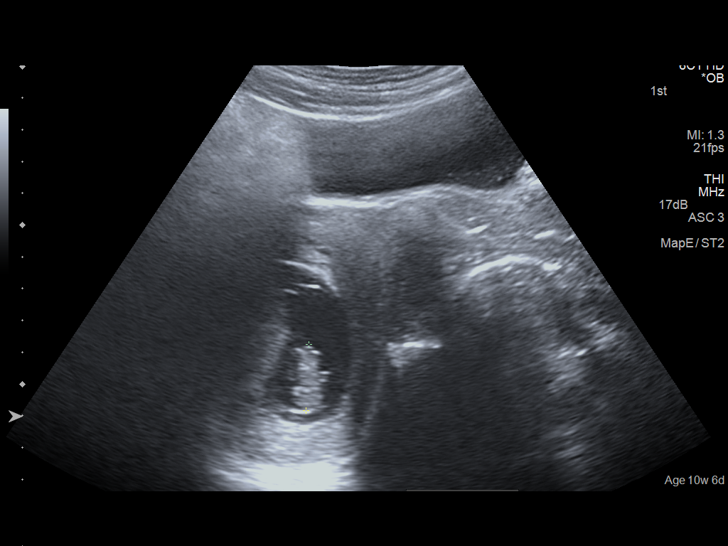
[im 3/22]
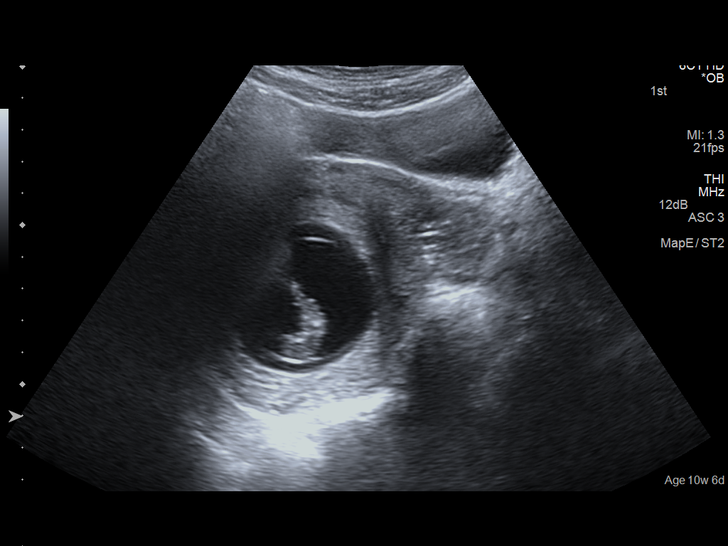
[im 4/22]
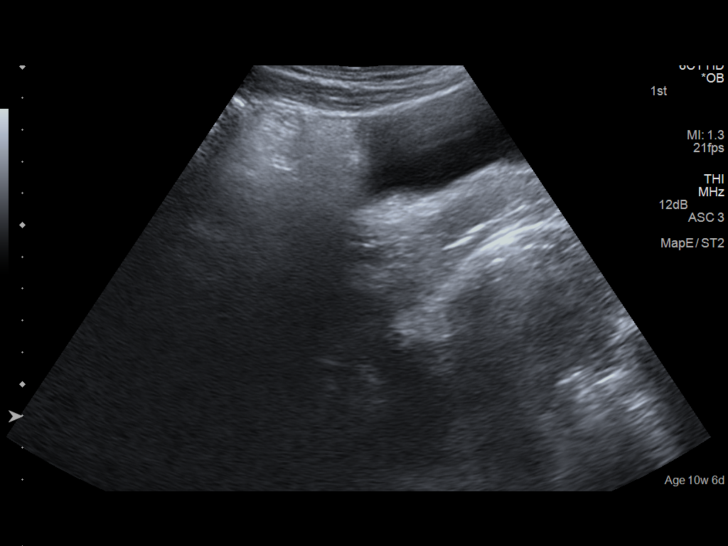
[im 6/22]
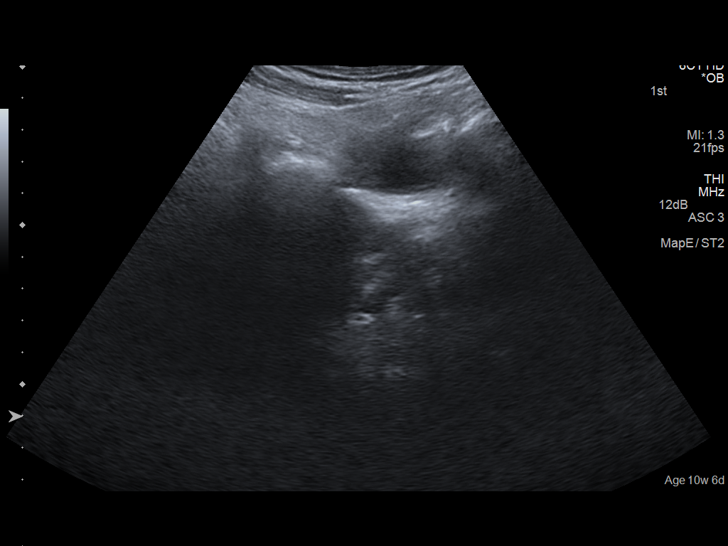
[im 8/22]
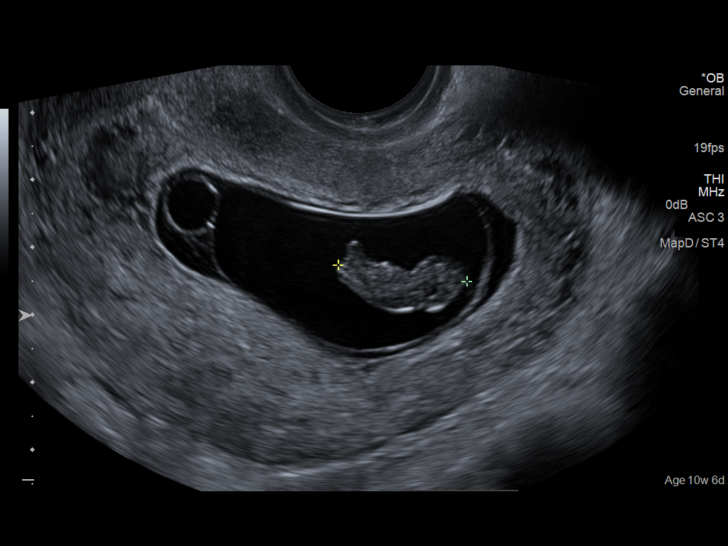
[im 9/22]
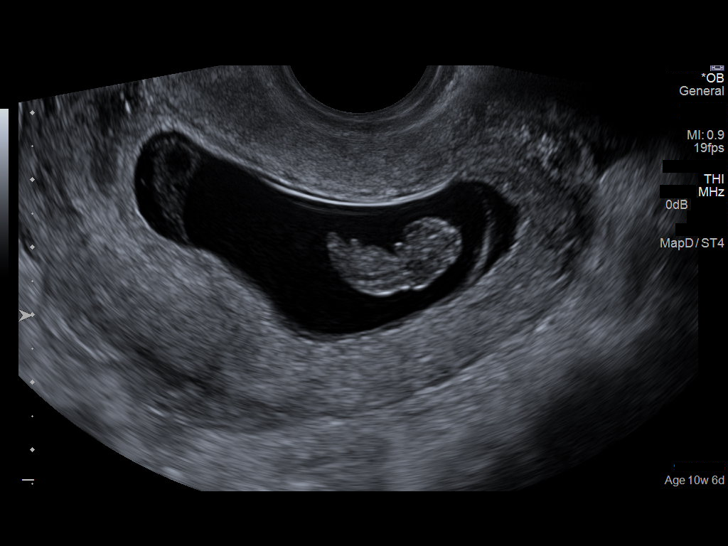
[im 11/22]
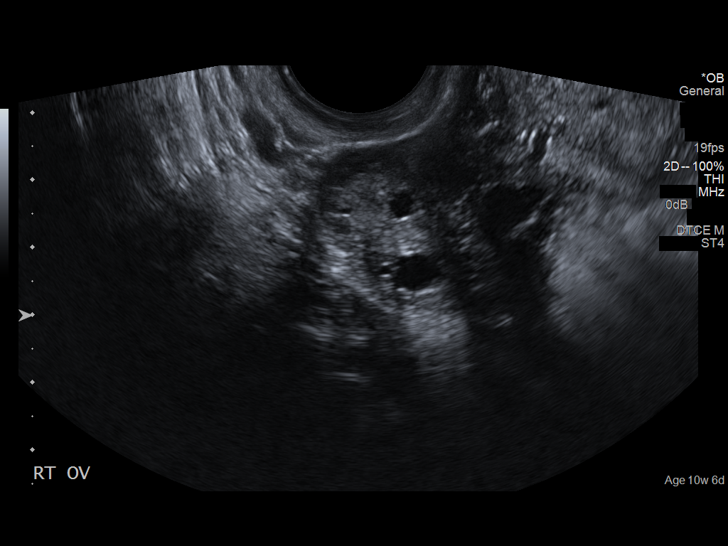
[im 12/22]
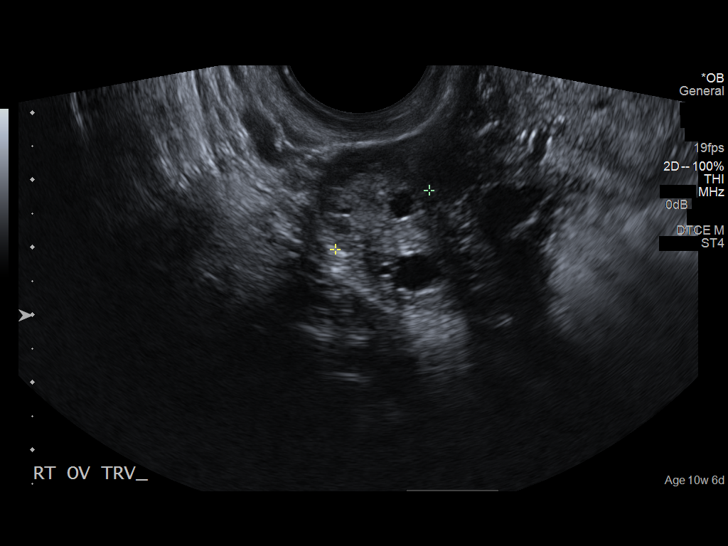
[im 14/22]
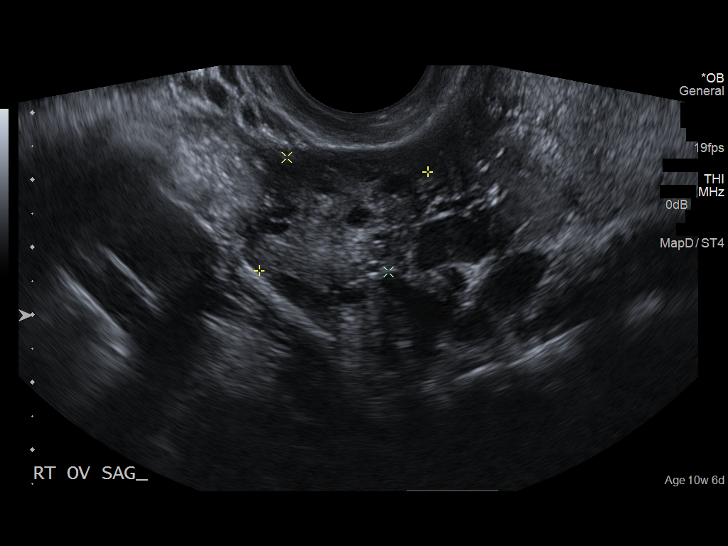
[im 15/22]
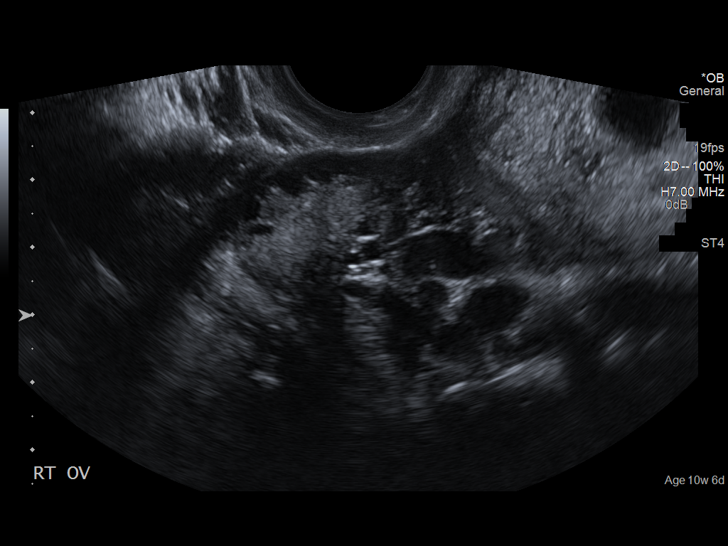
[im 17/22]
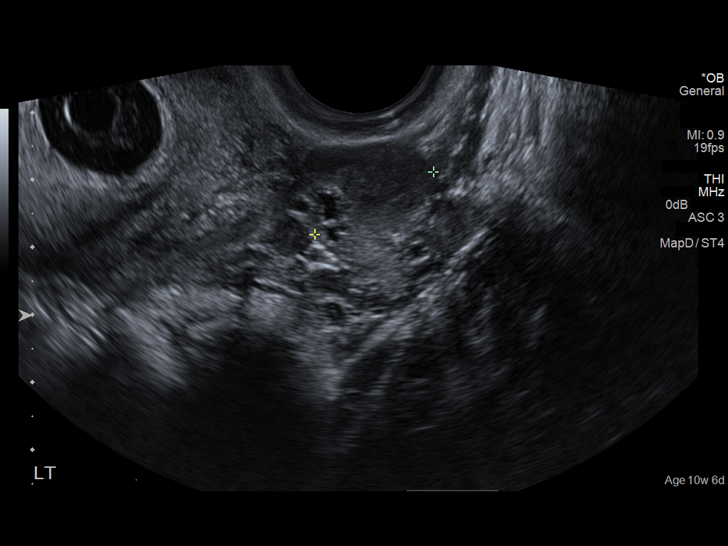
[im 19/22]
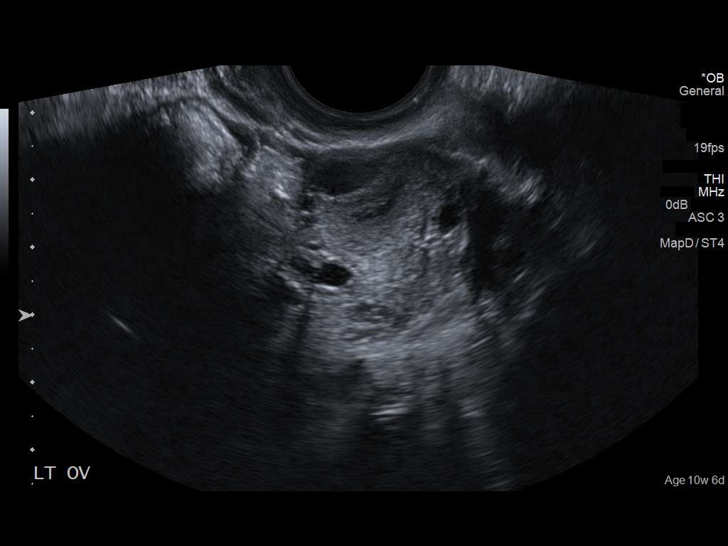
[im 20/22]
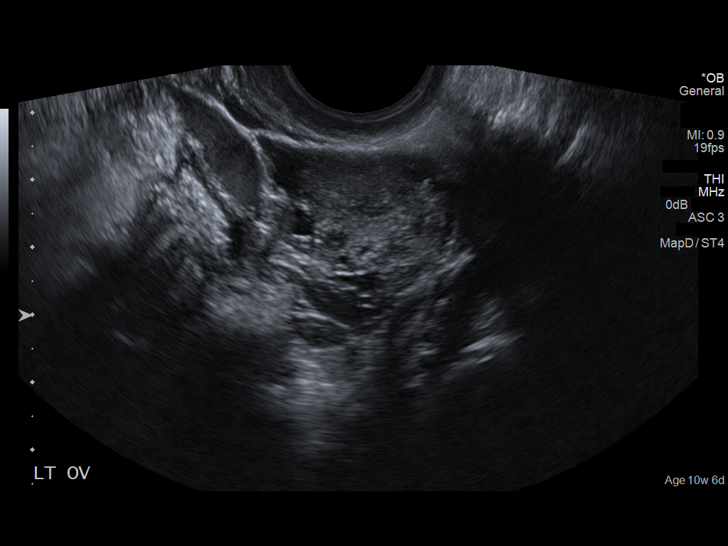
[im 22/22]
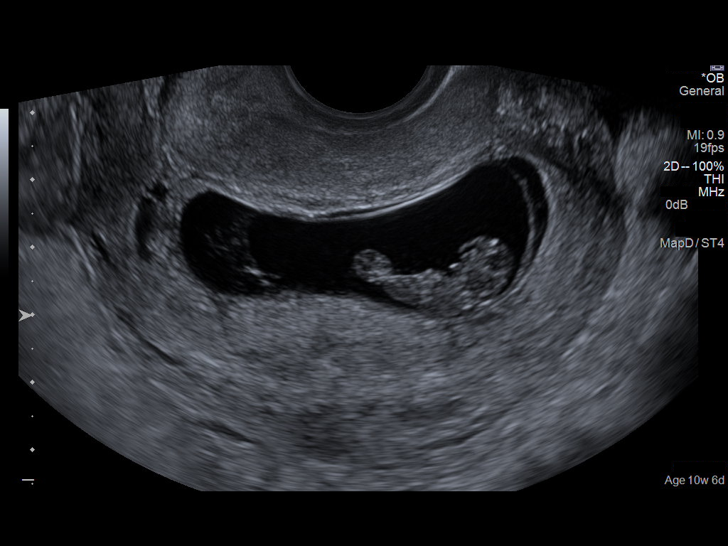

[14 of 22 positions shown; findings below may reference images not displayed]

FINDINGS: Intrauterine gestational sac: Visualized/normal in shape.

Yolk sac:  Yes

Embryo:  Yes

Cardiac Activity: No

CRL:  1.9 cm   8 w   3 d                  US EDC: 10/21/2015

Maternal uterus/adnexae: No subchorionic hemorrhage is noted. The
uterus is otherwise unremarkable.

The ovaries are unremarkable in appearance. The right ovary measures
2.9 x 2.3 x 1.6 cm, while the left ovary measures 2.4 x 2.4 x
cm. There is no evidence for ovarian torsion.

No free fluid is seen within the pelvic cul-de-sac.
IMPRESSION: No cardiac activity is visualized, compatible with fetal demise. The
crown-rump length is 1.9 cm, reflecting a gestational age of 8 weeks
3 days, which is more than 2 weeks delayed from the gestational age
by LMP.
